# Patient Record
Sex: Female | Born: 1935 | Race: White | Hispanic: No | Smoking: Never smoker
Health system: Southern US, Community
[De-identification: ages and names within clinical notes are randomized; demographics above are authoritative.]

## PROBLEM LIST (undated history)

## (undated) DIAGNOSIS — M1990 Unspecified osteoarthritis, unspecified site: Secondary | ICD-10-CM

## (undated) DIAGNOSIS — K635 Polyp of colon: Secondary | ICD-10-CM

## (undated) DIAGNOSIS — G8929 Other chronic pain: Secondary | ICD-10-CM

## (undated) DIAGNOSIS — M545 Other chronic pain: Secondary | ICD-10-CM

## (undated) DIAGNOSIS — E538 Deficiency of other specified B group vitamins: Secondary | ICD-10-CM

## (undated) DIAGNOSIS — N8110 Cystocele, unspecified: Secondary | ICD-10-CM

## (undated) DIAGNOSIS — D649 Anemia, unspecified: Secondary | ICD-10-CM

## (undated) DIAGNOSIS — K743 Primary biliary cirrhosis: Secondary | ICD-10-CM

## (undated) DIAGNOSIS — K219 Gastro-esophageal reflux disease without esophagitis: Secondary | ICD-10-CM

## (undated) DIAGNOSIS — M419 Scoliosis, unspecified: Secondary | ICD-10-CM

## (undated) DIAGNOSIS — R188 Other ascites: Secondary | ICD-10-CM

## (undated) DIAGNOSIS — K766 Portal hypertension: Secondary | ICD-10-CM

## (undated) DIAGNOSIS — E039 Hypothyroidism, unspecified: Secondary | ICD-10-CM

## (undated) DIAGNOSIS — K5790 Diverticulosis of intestine, part unspecified, without perforation or abscess without bleeding: Secondary | ICD-10-CM

## (undated) HISTORY — DX: Low back pain: M54.5

## (undated) HISTORY — PX: APPENDECTOMY: SHX54

## (undated) HISTORY — DX: Other ascites: R18.8

## (undated) HISTORY — DX: Hypocalcemia: E83.51

## (undated) HISTORY — DX: Other chronic pain: M54.50

## (undated) HISTORY — PX: BLADDER SURGERY: SHX569

## (undated) HISTORY — DX: Portal hypertension: K76.6

## (undated) HISTORY — DX: Gastro-esophageal reflux disease without esophagitis: K21.9

## (undated) HISTORY — DX: Cystocele, unspecified: N81.10

## (undated) HISTORY — DX: Hypothyroidism, unspecified: E03.9

## (undated) HISTORY — DX: Scoliosis, unspecified: M41.9

## (undated) HISTORY — DX: Anemia, unspecified: D64.9

## (undated) HISTORY — DX: Polyp of colon: K63.5

## (undated) HISTORY — DX: Deficiency of other specified B group vitamins: E53.8

## (undated) HISTORY — DX: Diverticulosis of intestine, part unspecified, without perforation or abscess without bleeding: K57.90

## (undated) HISTORY — DX: Other chronic pain: G89.29

## (undated) HISTORY — PX: ABDOMINAL HYSTERECTOMY: SHX81

## (undated) HISTORY — DX: Primary biliary cirrhosis: K74.3

---

## 2001-08-23 ENCOUNTER — Encounter: Payer: Self-pay | Admitting: Family Medicine

## 2001-08-23 ENCOUNTER — Encounter: Admission: RE | Admit: 2001-08-23 | Discharge: 2001-08-23 | Payer: Self-pay | Admitting: Family Medicine

## 2001-11-27 ENCOUNTER — Other Ambulatory Visit: Admission: RE | Admit: 2001-11-27 | Discharge: 2001-11-27 | Payer: Self-pay | Admitting: Obstetrics and Gynecology

## 2001-12-16 ENCOUNTER — Encounter: Payer: Self-pay | Admitting: Obstetrics and Gynecology

## 2001-12-16 ENCOUNTER — Encounter: Admission: RE | Admit: 2001-12-16 | Discharge: 2001-12-16 | Payer: Self-pay | Admitting: Obstetrics and Gynecology

## 2004-04-04 ENCOUNTER — Encounter: Admission: RE | Admit: 2004-04-04 | Discharge: 2004-04-04 | Payer: Self-pay | Admitting: Family Medicine

## 2005-04-14 ENCOUNTER — Other Ambulatory Visit: Admission: RE | Admit: 2005-04-14 | Discharge: 2005-04-14 | Payer: Self-pay | Admitting: Obstetrics and Gynecology

## 2005-05-09 ENCOUNTER — Encounter: Admission: RE | Admit: 2005-05-09 | Discharge: 2005-05-09 | Payer: Self-pay | Admitting: Obstetrics and Gynecology

## 2010-07-04 DIAGNOSIS — R079 Chest pain, unspecified: Secondary | ICD-10-CM

## 2013-05-13 ENCOUNTER — Telehealth: Payer: Self-pay

## 2013-05-13 ENCOUNTER — Telehealth: Payer: Self-pay | Admitting: Internal Medicine

## 2013-05-13 NOTE — Telephone Encounter (Signed)
Rec'd from [place] forward 22 pages to Historical Provider

## 2013-05-26 ENCOUNTER — Encounter: Payer: Self-pay | Admitting: Internal Medicine

## 2013-07-14 NOTE — Telephone Encounter (Signed)
Appointment with Dr Carlean Purl 07-17-13/yf

## 2013-07-17 ENCOUNTER — Ambulatory Visit: Payer: Medicare Other | Admitting: Internal Medicine

## 2013-07-22 ENCOUNTER — Encounter: Payer: Self-pay | Admitting: Internal Medicine

## 2013-09-17 ENCOUNTER — Ambulatory Visit: Payer: Medicare Other | Admitting: Internal Medicine

## 2013-09-17 ENCOUNTER — Encounter: Payer: Self-pay | Admitting: Gastroenterology

## 2013-09-17 ENCOUNTER — Ambulatory Visit (INDEPENDENT_AMBULATORY_CARE_PROVIDER_SITE_OTHER): Payer: Medicare Other | Admitting: Gastroenterology

## 2013-09-17 ENCOUNTER — Other Ambulatory Visit (INDEPENDENT_AMBULATORY_CARE_PROVIDER_SITE_OTHER): Payer: Medicare Other

## 2013-09-17 VITALS — BP 110/60 | HR 68 | Ht 64.0 in | Wt 131.0 lb

## 2013-09-17 DIAGNOSIS — K743 Primary biliary cirrhosis: Secondary | ICD-10-CM

## 2013-09-17 DIAGNOSIS — K7460 Unspecified cirrhosis of liver: Secondary | ICD-10-CM

## 2013-09-17 DIAGNOSIS — K745 Biliary cirrhosis, unspecified: Secondary | ICD-10-CM

## 2013-09-17 LAB — COMPREHENSIVE METABOLIC PANEL
ALT: 21 U/L (ref 0–35)
AST: 30 U/L (ref 0–37)
Albumin: 3.9 g/dL (ref 3.5–5.2)
Alkaline Phosphatase: 109 U/L (ref 39–117)
BUN: 33 mg/dL — ABNORMAL HIGH (ref 6–23)
CO2: 25 mEq/L (ref 19–32)
CREATININE: 1.3 mg/dL — AB (ref 0.4–1.2)
Calcium: 9.3 mg/dL (ref 8.4–10.5)
Chloride: 105 mEq/L (ref 96–112)
GFR: 42.88 mL/min — ABNORMAL LOW (ref >60.00)
Glucose, Bld: 114 mg/dL — ABNORMAL HIGH (ref 70–99)
Potassium: 4.5 mEq/L (ref 3.5–5.1)
Sodium: 137 mEq/L (ref 135–145)
Total Bilirubin: 0.6 mg/dL (ref 0.2–1.2)
Total Protein: 7 g/dL (ref 6.0–8.3)

## 2013-09-17 LAB — CBC
HCT: 34.4 % — ABNORMAL LOW (ref 36.0–46.0)
Hemoglobin: 11.8 g/dL — ABNORMAL LOW (ref 12.0–15.0)
MCHC: 34.4 g/dL (ref 30.0–36.0)
MCV: 92.9 fl (ref 78.0–100.0)
PLATELETS: 129 10*3/uL — AB (ref 150.0–400.0)
RBC: 3.7 Mil/uL — ABNORMAL LOW (ref 3.87–5.11)
RDW: 13.8 % (ref 11.5–15.5)
WBC: 5 10*3/uL (ref 4.0–10.5)

## 2013-09-17 LAB — PROTIME-INR
INR: 1 ratio (ref 0.8–1.0)
Prothrombin Time: 11.4 s (ref 9.6–13.1)

## 2013-09-17 MED ORDER — URSODIOL 300 MG PO CAPS: 300.0000 mg | ORAL_CAPSULE | Freq: Three times a day (TID) | ORAL | Status: DC

## 2013-09-17 NOTE — Patient Instructions (Signed)
Your physician has requested that you go to the basement for the following lab work before leaving today: CBC CMP PT-INR AFP  You have been scheduled for an abdominal ultrasound at [facility] Radiology (1st floor of hospital) on 09-19-2013 at 8 am. Please arrive 15 minutes prior to your appointment for registration. Make certain not to have anything to eat or drink 6 hours prior to your appointment. Should you need to reschedule your appointment, please contact radiology at [phone]. This test typically takes about 30 minutes to perform.  You have a follow up visit scheduled with Dr. Henrene Pastor for 12-11-2013 at 33 AM.  Refill of Ursodiol was sent to your pharmacy.

## 2013-09-18 LAB — AFP TUMOR MARKER: AFP TUMOR MARKER: 12.1 ng/mL — AB (ref 0.0–8.0)

## 2013-09-19 ENCOUNTER — Telehealth: Payer: Self-pay | Admitting: Gastroenterology

## 2013-09-19 ENCOUNTER — Ambulatory Visit (HOSPITAL_COMMUNITY): Payer: Medicare Other

## 2013-09-19 DIAGNOSIS — K7460 Unspecified cirrhosis of liver: Secondary | ICD-10-CM

## 2013-09-19 NOTE — Telephone Encounter (Signed)
Patient no showed her [employer] today. Called Radiology to rescheduled.  You have been scheduled for an abdominal ultrasound at [facility] Radiology (1st floor of hospital) on 09-24-2013 at 930 am. Please arrive 15 minutes prior to your appointment for registration. Make certain not to have anything to eat or drink 6 hours prior to your appointment. Should you need to reschedule your appointment, please contact radiology at [phone]. This test typically takes about 30 minutes to perform.   Called patient to give an appointment date and time. Patient verbalized understanding.

## 2013-09-24 ENCOUNTER — Ambulatory Visit (HOSPITAL_COMMUNITY)
Admission: RE | Admit: 2013-09-24 | Discharge: 2013-09-24 | Disposition: A | Discharge status: Home / Self Care | Payer: Medicare Other | Source: Ambulatory Visit | Attending: Gastroenterology | Admitting: Gastroenterology

## 2013-09-24 DIAGNOSIS — K7460 Unspecified cirrhosis of liver: Secondary | ICD-10-CM

## 2013-09-25 DIAGNOSIS — K743 Primary biliary cirrhosis: Secondary | ICD-10-CM | POA: Insufficient documentation

## 2013-09-25 DIAGNOSIS — K7460 Unspecified cirrhosis of liver: Secondary | ICD-10-CM | POA: Insufficient documentation

## 2013-09-25 NOTE — Progress Notes (Addendum)
09/25/2013 Jessica French [id number] November 21, 1926   HISTORY OF PRESENT ILLNESS:  This is a pleasant 80 year old female who has PBC and was previously followed by Dr. Lincoln Brigham at [facility].  She was last seen there in 05/2012 at which time she was stable and well compensated.  She was asked to continue her ursodiol 300 mg TID as she had been doing and was told to follow up in 8 months.  She is here today to establish care more locally so that she does not have to keep travelling to [place].  She was diagnosed with PBC in 2008 and has never undergone liver biopsy.  She has been maintained on ursodiol 300 mg TID without interruption and has done well with no sign of decompensation.  Her main complaints are fatigue and dry mouth/dry eyes, which they believe is from sicca syndrome related to her PBC.  She denies any abdominal pain or dark/bloody stool.  Denies yellowing of her eyes or change in mental status/confusion.  She has swelling in her ankles but she says that they have been like that as long as she can remember.  She is here today alone.  *She thinks that she had EGD and colonoscopy at some point several years ago at [place] so we will try to obtain those records.   Past Medical History  Diagnosis Date  . PBC (primary biliary cirrhosis)   . Hypothyroidism     Dx before age 75  . Hypocalcemia   . GERD (gastroesophageal reflux disease)   . Vitamin B12 deficiency   . Colon polyps     uncertain pathology  . Scoliosis   . Chronic low back pain   . Scoliosis   . Female bladder prolapse    Past Surgical History  Procedure Laterality Date  . Bladder surgery      prolapse, multiple  . Abdominal hysterectomy    . Appendectomy      reports that she has never smoked. She has never used smokeless tobacco. She reports that she does not drink alcohol or use illicit drugs. family history includes Breast cancer in her maternal aunt; Cancer in her mother; Diabetes in her father; Heart disease in her  brother; Liver disease in an other family member; Stomach cancer in her maternal grandmother. Allergies  Allergen Reactions  . Tobramycin     EYE DROPS      Outpatient Encounter Prescriptions as of 09/17/2013  Medication Sig  . calcium carbonate (TUMS - DOSED IN MG ELEMENTAL CALCIUM) 500 MG chewable tablet Chew 1 tablet by mouth daily.  Marland Kitchen HYDROcodone-acetaminophen (VICODIN) 5-500 MG per tablet Take 1 tablet by mouth every 6 (six) hours as needed for pain.  Marland Kitchen levothyroxine (SYNTHROID, LEVOTHROID) 50 MCG tablet Take 50 mcg by mouth daily before breakfast.  . MAGNESIUM PO Take 1 tablet by mouth daily.  . meloxicam (MOBIC) 15 MG tablet Take 15 mg by mouth daily.  Marland Kitchen MILK THISTLE PO Take 1 tablet by mouth daily.  . Multiple Vitamins-Minerals (CENTRUM SILVER PO) Take 1 tablet by mouth daily.  . ursodiol (ACTIGALL) 300 MG capsule Take 1 capsule (300 mg total) by mouth 3 (three) times daily.  . vitamin B-12 (CYANOCOBALAMIN) 1000 MCG tablet Take 1,000 mcg by mouth daily.  . [DISCONTINUED] ursodiol (ACTIGALL) 300 MG capsule Take 300 mg by mouth 3 (three) times daily.     REVIEW OF SYSTEMS  : All other systems reviewed and negative except where noted in the  History of Present Illness.   PHYSICAL EXAM: BP 110/60  Pulse 68  Ht 5\' 4"  (1.626 m)  Wt 131 lb (59.421 kg)  BMI 22.47 kg/m2 General: Well developed white female in no acute distress Head: Normocephalic and atraumatic Eyes:  Sclerae anicteric, conjunctiva pink. Ears: Normal auditory acuity  Lungs: Clear throughout to auscultation Heart: Regular rate and rhythm Abdomen: Soft, non-distended.  Normal bowel sounds.  Non-tender. Musculoskeletal: Symmetrical with no gross deformities  Skin: No lesions on visible extremities Extremities:  Mild B/L LE non-pitting edema. Neurological: Alert oriented x 4, grossly non-focal Psychological:  Alert and cooperative. Normal mood and affect  ASSESSMENT AND PLAN: -PBC:  Well compensated when last  seen by Dr. Lincoln Brigham in 05/2012.  Appears clinically well today, but here to establish GI care so she no longer has to make the trip to [facility].  Will start with some labs including CBC, CMP, PT/INR, and AFP.  Will also perform imaging in the form of ultrasound.  Continue ursodiol 300 mg TID.  Follow-up in 3 months with Dr. Henrene Pastor.  *We will also obtain previous endoscopy records from [facility].  **Addendum:  Colonoscopy report from 12/2031 by Dr. Sammuel Cooper revealed left sided diverticulosis and two 1.0 cm rectal polyps that were removed.  One polyp was tubulovillous adenoma with no high-grade dysplasia but change appeared to extend to the margin of the resection; the other polyp was a tubular adenoma with marked acute inflammation without high-grade dysplasia.  I did not receive any upper endoscopy reports.

## 2013-09-26 NOTE — Progress Notes (Signed)
Agree 

## 2013-12-11 ENCOUNTER — Ambulatory Visit: Payer: Medicare Other | Admitting: Internal Medicine

## 2013-12-12 ENCOUNTER — Encounter: Payer: Self-pay | Admitting: Internal Medicine

## 2013-12-12 ENCOUNTER — Ambulatory Visit (INDEPENDENT_AMBULATORY_CARE_PROVIDER_SITE_OTHER): Payer: Medicare Other | Admitting: Internal Medicine

## 2013-12-12 VITALS — BP 104/60 | HR 80 | Ht 60.5 in | Wt 128.5 lb

## 2013-12-12 DIAGNOSIS — K766 Portal hypertension: Secondary | ICD-10-CM

## 2013-12-12 DIAGNOSIS — K7469 Other cirrhosis of liver: Secondary | ICD-10-CM

## 2013-12-12 DIAGNOSIS — K743 Primary biliary cirrhosis: Secondary | ICD-10-CM

## 2013-12-12 MED ORDER — URSODIOL 300 MG PO CAPS: 300.0000 mg | ORAL_CAPSULE | Freq: Three times a day (TID) | ORAL | Status: DC

## 2013-12-12 NOTE — Progress Notes (Signed)
HISTORY OF PRESENT ILLNESS:  Jessica French is a 80 y.o. female who established as a new GI patient 09/17/2013 for management of her chronic primary biliary cirrhosis. She has been cared for at [facility]. See that synopsis. She presents today for routine followup. At the time of her last visit blood work was obtained. She did have slightly low platelets of 129,000. Liver tests and albumin were normal. PT/INR was normal. Alpha-fetoprotein was 12.1. Abdominal ultrasound was performed. Changes of cirrhosis with mild splenomegaly noted. Otherwise negative. No hepatic lesions. We reviewed these data today. She has been doing well since that visit. No problems with edema, mentation, GI bleeding, itching, or jaundice. She does continue with nonspecific fatigue. She lives in [place]. Her primary care physician is Dr. Wenda Overland. She requested a refill of her ursodiol today. GI review of systems is otherwise remarkable for belching and flatus  REVIEW OF SYSTEMS:  All non-GI ROS negative except for sinus and allergy, arthritis, back pain, fatigue, muscle cramps, itching, hearing problems, sleeping problems, increased thirst, urinary leakage  Past Medical History  Diagnosis Date  . PBC (primary biliary cirrhosis)   . Hypothyroidism     Dx before age 27  . Hypocalcemia   . GERD (gastroesophageal reflux disease)   . Vitamin B12 deficiency   . Colon polyps     uncertain pathology  . Scoliosis   . Chronic low back pain   . Scoliosis   . Female bladder prolapse     Past Surgical History  Procedure Laterality Date  . Bladder surgery      prolapse, multiple  . Abdominal hysterectomy    . Appendectomy      Social History Jessica French  reports that she has never smoked. She has never used smokeless tobacco. She reports that she does not drink alcohol or use illicit drugs.  family history includes Breast cancer in her maternal aunt; Diabetes in her father; Heart disease in her brother; Leukemia in her  mother; Liver disease in an other family member; Stomach cancer in her maternal grandmother.  Allergies  Allergen Reactions  . Tobramycin     EYE DROPS       PHYSICAL EXAMINATION: Vital signs: BP 104/60  Pulse 80  Ht 5' 0.5" (1.537 m)  Wt 128 lb 8 oz (58.287 kg)  BMI 24.67 kg/m2  Constitutional: generally well-appearing, no acute distress Psychiatric: alert and oriented x3, cooperative Eyes: extraocular movements intact, anicteric, conjunctiva pink Mouth: oral pharynx moist, no lesions Neck: supple no lymphadenopathy Cardiovascular: heart regular rate and rhythm, no murmur Lungs: clear to auscultation bilaterally Abdomen: soft, nontender, nondistended, no obvious ascites, no peritoneal signs, normal bowel sounds, no organomegaly Rectal: Omitted Extremities: no lower extremity edema bilaterally. Varicose veins present Skin: no lesions on visible extremities Neuro: No focal deficits. No asterixis.    ASSESSMENT:  #1. Primary biliary cirrhosis with compensated hepatic cirrhosis. #2. History of adenomatous colon polyps. Last colonoscopy 2003 #3. General medical problems. Stable   PLAN:  #1. Continue ursodiol. Prescription refilled. 300 mg 3 times a day #2. Continue vitamin supplements #3. Office followup in 9 months. Repeat laboratories and imaging at that time. Interval followup as needed

## 2013-12-12 NOTE — Patient Instructions (Signed)
We have sent the following medications to your pharmacy for you to pick up at your convenience:  Ursodiol  Please follow up with Dr. Henrene Pastor in 9 months

## 2013-12-15 ENCOUNTER — Telehealth: Payer: Self-pay | Admitting: Internal Medicine

## 2013-12-17 NOTE — Telephone Encounter (Signed)
Sent Dr. Henrene Pastor message asking if there is an alternative to [place].  Awaiting response

## 2013-12-24 ENCOUNTER — Telehealth: Payer: Self-pay

## 2013-12-24 NOTE — Telephone Encounter (Signed)
See phone note dated 12/24/2013

## 2013-12-24 NOTE — Telephone Encounter (Signed)
Spoke with patient and told her that per Dr. Henrene Pastor there was no alternative to her Ursodiol.  Patient stated she is in the donut hole and is going to check into some new plans at the first of the year.

## 2013-12-24 NOTE — Telephone Encounter (Signed)
Message copied by Audrea Muscat on Wed Dec 24, 2013  4:00 PM ------      Message from: Irene Shipper      Created: Wed Dec 17, 2013 11:56 AM       No. It should be exactly what she has been taking for years. Generic, if available, would be okay(ursodiol is the generic name)       ----- Message -----         From: Audrea Muscat, CMA         Sent: 12/17/2013  11:10 AM           To: Irene Shipper, MD            Patient has called stating the Ursodiol she takes is too expensive and wants to know if there is an alternative.  Please advise.       ------

## 2014-03-18 ENCOUNTER — Telehealth: Payer: Self-pay | Admitting: Internal Medicine

## 2014-03-25 ENCOUNTER — Other Ambulatory Visit: Payer: Self-pay

## 2014-03-25 NOTE — Telephone Encounter (Signed)
Faxed tier exception request form for Actig[employer] to [employer].  Awaiting response.  Spoke with patient and let her know I am awaiting answer from [employer].  Patient understood.

## 2014-04-01 ENCOUNTER — Telehealth: Payer: Self-pay

## 2014-04-01 NOTE — Telephone Encounter (Signed)
Spoke with patient and told her that the Actigall coverage was denied.  Patient concerned that going forward she will not be able to afford it at all and will need to stop.  Told her I would ask Dr. Henrene Pastor how serious the ramifications would be if she had to stop taking Actigall.  Patient agreed.

## 2014-04-01 NOTE — Telephone Encounter (Signed)
Communicated Dr. Blanch Media answer to patient: that with a stable liver profile he would not expect acute decompensation if she had to stop the Actigall.  Patient is going to communicate with her insurance company to see if she has any alternatives regarding the cost.  I told her to call if she needed anything else.  Patient agreed.

## 2014-04-01 NOTE — Telephone Encounter (Signed)
-----   Message from Irene Shipper, MD sent at 04/01/2014  9:20 AM EST ----- Hard to know for certain, but at her age with stable liver profile, reasonable to stop if she can't afford it. I would not expect acute decompensation. ----- Message -----    From: Audrea Muscat, CMA    Sent: 04/01/2014   9:10 AM      To: Irene Shipper, MD  Patient's insurance will not cover her Actigall at all and she is concerned she may not be able to afford it.  She wanted to know what the ramifications would be if it was so cost prohibitive she had to stop taking it altogether.

## 2014-04-02 NOTE — Telephone Encounter (Signed)
See note dated 04/01/2014

## 2014-11-03 ENCOUNTER — Encounter: Payer: Self-pay | Admitting: Internal Medicine

## 2014-12-16 ENCOUNTER — Ambulatory Visit (INDEPENDENT_AMBULATORY_CARE_PROVIDER_SITE_OTHER): Payer: Medicare Other | Admitting: Physician Assistant

## 2014-12-16 ENCOUNTER — Encounter: Payer: Self-pay | Admitting: Physician Assistant

## 2014-12-16 ENCOUNTER — Other Ambulatory Visit (INDEPENDENT_AMBULATORY_CARE_PROVIDER_SITE_OTHER): Payer: Medicare Other

## 2014-12-16 VITALS — BP 140/66 | HR 76 | Ht 64.0 in | Wt 125.6 lb

## 2014-12-16 DIAGNOSIS — R1031 Right lower quadrant pain: Secondary | ICD-10-CM

## 2014-12-16 DIAGNOSIS — K625 Hemorrhage of anus and rectum: Secondary | ICD-10-CM | POA: Diagnosis not present

## 2014-12-16 DIAGNOSIS — K743 Primary biliary cirrhosis: Secondary | ICD-10-CM

## 2014-12-16 DIAGNOSIS — R197 Diarrhea, unspecified: Secondary | ICD-10-CM

## 2014-12-16 LAB — PROTIME-INR
INR: 1.1 ratio — AB (ref 0.8–1.0)
PROTHROMBIN TIME: 12.7 s (ref 9.6–13.1)

## 2014-12-16 NOTE — Progress Notes (Signed)
Agree with assessment and plans as outlined 

## 2014-12-16 NOTE — Progress Notes (Signed)
Patient ID: Jessica French, female   DOB: [birth year], 81 y.o.   MRN: [id number]   Subjective:    Patient ID: Jessica French, female    DOB: January 09, 1927, 81 y.o.   MRN: [id number]  HPI Jessica French is an 81 year old white female known to Dr. Scarlette Shorts who has a long history of primary biliary cirrhosis, with compensated cirrhosis. She has been maintained on Urso for many years. She was last seen in October 2015. She comes in today referred by her primary care physician Dr.  Wenda Overland in [place] after a recent episode of diarrhea with bright red blood per rectum. Patient says this episodes occurred about 3-4 weeks ago. She had one occasion of diarrhea followed by bright red blood dripping into the commode and another episode with loose stool and then blood noted on the tissue. She said during that episode she had diarrhea and urgency for about 6 days, there was some vague nausea no vomiting she did have some low-grade fever and lower abdominal cramping and discomfort. She has not had any recurrent bleeding or problems with diarrhea over the past 2 weeks. She denies any rectal discomfort. Her last colonoscopy was done in 2003 per Dr. Sammuel Cooper noted to have left-sided diverticulosis and 2, 1 cm rectal polyps were removed one was a tubular adenoma the other a tubovillous adenoma.  We requested recent labs done by her PCP. These were done on 12/09/2014 with hemoglobin 11 hematocrit of 33.1 MCV of 97 platelet count 81,000 a bit lower than 1 year ago. Albumin 3.3, alkaline phosphatase 141 liver tests otherwise normal, creatinine 0.84  Review of Systems Pertinent positive and negative review of systems were noted in the above HPI section.  All other review of systems was otherwise negative.  Outpatient Encounter Prescriptions as of 12/16/2014  Medication Sig  . calcium carbonate (TUMS - DOSED IN MG ELEMENTAL CALCIUM) 500 MG chewable tablet Chew 1 tablet by mouth as needed.   Marland Kitchen co-enzyme Q-10 30 MG capsule Take 30 mg by mouth daily.    . ferrous sulfate 325 (65 FE) MG tablet Take 325 mg by mouth daily with breakfast.  . folic acid (FOLVITE) 1 MG tablet Take 1 mg by mouth daily.  Marland Kitchen levothyroxine (SYNTHROID, LEVOTHROID) 50 MCG tablet Take 50 mcg by mouth daily before breakfast.  . MAGNESIUM PO Take 1 tablet by mouth daily.  Marland Kitchen MILK THISTLE PO Take 1 tablet by mouth daily.  . Misc Natural Products (OSTEO BI-FLEX ADV DOUBLE ST) CAPS Take 1 capsule by mouth daily.  . Multiple Vitamins-Minerals (CENTRUM SILVER PO) Take 1 tablet by mouth daily.  . Multiple Vitamins-Minerals (PRESERVISION AREDS 2) CAPS Take 1 capsule by mouth daily.  . Probiotic Product (FLORAJEN3 PO) Take 1 capsule by mouth daily.  . ursodiol (ACTIGALL) 300 MG capsule Take 1 capsule (300 mg total) by mouth 3 (three) times daily.  . vitamin B-12 (CYANOCOBALAMIN) 1000 MCG tablet Take 1,000 mcg by mouth daily.  . vitamin E 400 UNIT capsule Take 400 Units by mouth daily.  . [DISCONTINUED] HYDROcodone-acetaminophen (VICODIN) 5-500 MG per tablet Take 1 tablet by mouth every 6 (six) hours as needed for pain.  . [DISCONTINUED] meloxicam (MOBIC) 15 MG tablet Take 15 mg by mouth daily.   No facility-administered encounter medications on file as of 12/16/2014.   Allergies  Allergen Reactions  . Tobramycin     EYE DROPS   Patient Active Problem List   Diagnosis Date Noted  . Cirrhosis of liver without mention  of alcohol 09/25/2013  . Primary biliary cirrhosis ([place]) 09/25/2013   Social History   Social History  . Marital Status: Divorced    Spouse Name: N/A  . Number of Children: 2  . Years of Education: N/A   Occupational History  . retired    Social History Main Topics  . Smoking status: Never Smoker   . Smokeless tobacco: Never Used  . Alcohol Use: No  . Drug Use: No  . Sexual Activity: Not on file   Other Topics Concern  . Not on file   Social History Narrative    Jessica French's family history includes Breast cancer in her maternal aunt; Diabetes in  her father; Heart disease in her brother; Leukemia in her mother; Liver disease in an other family member; Stomach cancer in her maternal grandmother.      Objective:    Filed Vitals:   12/16/14 1048  BP: 140/66  Pulse: 76    Physical Exam  well-developed elderly white female in no acute distress, quite pleasant, accompanied by daughter blood pressure 140/66, pulse 76 height 5 foot 4 weight 125. HEENT; nontraumatic normocephalic EOMI PERRLA sclera anicteric, Cardiovascular; regular rate and rhythm with S1-S2 no murmur or gallop, Pulmonary ;clear bilaterally, Abdomen; soft with some mild tenderness bilaterally in the lower quadrants right greater than left no guarding or rebound no palpable mass or hepatosplenomegaly no fluid wave, Rectal ;exam no external hemorrhoids noted digital exam no lesion noted scant stool Hemoccult negative, Ext; no clubbing cyanosis or edema skin warm and dry, Neuropsych ;mood and affect appropriate she is somewhat HOH       Assessment & Plan:   #1 81 yo female with PBC longstanding, maintained on Urso- labs stable except platelet count lower than 2015. #2  2 recent episodes of brb per rectum - associated with diarrhea and lower abdominal discomfort . Diarrhea has resolved ,lower abdominal  discomfort persists. Bleeding may have been secondary to internal hemorrhoids nut last colonoscopy 13 years  Ago so cannot r/o occult lesion. #3 Mild anemia- chronic #4 thrombocyopenia - chronic   Plan; check PT, AFP   Schedule for Ct abd/pelvis with contrast  Due to age and cirrhosis would like to avoid colonoscopy- pt advised to call for any recurrence of bleeding, and would reconsider for colonoscopy   Continue Urso 300 mg TID     Alfredia Ferguson PA-C 12/16/2014   Cc: Celedonio Savage, MD

## 2014-12-16 NOTE — Patient Instructions (Signed)
Please go to the basement level to have your labs drawn.   You have been scheduled for a CT scan of the abdomen and pelvis at [facility] ([address]---this is in the same building as [facility]).   You are scheduled on  Wednesday 12-23-2014 at 9:30 am. You should arrive at 9:15 am prior to your appointment time for registration. Please follow the written instructions below on the day of your exam:  WARNING: IF YOU ARE ALLERGIC TO IODINE/X-RAY DYE, PLEASE NOTIFY RADIOLOGY IMMEDIATELY AT [phone]! YOU WILL BE GIVEN A 13 HOUR PREMEDICATION PREP.  1) Do not eat or drink anything after 5:30 am  (4 hours prior to your test) 2) You have been given 2 bottles of oral contrast to drink. The solution may taste better if refrigerated, but do NOT add ice or any other liquid to this solution. Shake  well before drinking.    Drink 1 bottle of contrast @ 7:30 am  (2 hours prior to your exam)  Drink 1 bottle of contrast @ 8:30 am  (1 hour prior to your exam)  You may take any medications as prescribed with a small amount of water except for the following: Metformin, Glucophage, Glucovance, Avandamet, Riomet, Fortamet, Actoplus Met, Janumet, Glumetza or Metaglip. The above medications must be held the day of the exam AND 48 hours after the exam.  The purpose of you drinking the oral contrast is to aid in the visualization of your intestinal tract. The contrast solution may cause some diarrhea. Before your exam is started, you will be given a small amount of fluid to drink. Depending on your individual set of symptoms, you may also receive an intravenous injection of x-ray contrast/dye. Plan on being at [facility] for 30 minutes or long, depending on the type of exam you are having performed.  If you have any questions regarding your exam or if you need to reschedule, you may call the CT department at [phone] between the hours of 8:00 am and 5:00 pm,  Monday-Friday.  ________________________________________________________________________   Call us for any recurrent bleeding.

## 2014-12-17 LAB — AFP TUMOR MARKER: AFP TUMOR MARKER: 5.5 ng/mL (ref <6.1)

## 2014-12-23 ENCOUNTER — Ambulatory Visit (INDEPENDENT_AMBULATORY_CARE_PROVIDER_SITE_OTHER)
Admission: RE | Admit: 2014-12-23 | Discharge: 2014-12-23 | Disposition: A | Discharge status: Home / Self Care | Payer: Medicare Other | Source: Ambulatory Visit | Attending: Physician Assistant | Admitting: Physician Assistant

## 2014-12-23 DIAGNOSIS — K625 Hemorrhage of anus and rectum: Secondary | ICD-10-CM

## 2014-12-23 DIAGNOSIS — R197 Diarrhea, unspecified: Secondary | ICD-10-CM

## 2014-12-23 DIAGNOSIS — K743 Primary biliary cirrhosis: Secondary | ICD-10-CM | POA: Diagnosis not present

## 2014-12-23 DIAGNOSIS — R1031 Right lower quadrant pain: Secondary | ICD-10-CM | POA: Diagnosis not present

## 2014-12-23 MED ORDER — IOHEXOL 300 MG/ML  SOLN
100.0000 mL | Freq: Once | INTRAMUSCULAR | Status: AC | PRN
Start: 2014-12-23 — End: 2014-12-23
  Administered 2014-12-23: 100 mL via INTRAVENOUS

## 2014-12-29 ENCOUNTER — Telehealth: Payer: Self-pay | Admitting: Internal Medicine

## 2014-12-30 MED ORDER — URSODIOL 300 MG PO CAPS: 300.0000 mg | ORAL_CAPSULE | Freq: Three times a day (TID) | ORAL | Status: AC

## 2014-12-30 NOTE — Telephone Encounter (Signed)
Refilled Ursodiol 90 day supply

## 2015-03-22 ENCOUNTER — Telehealth: Payer: Self-pay | Admitting: Physician Assistant

## 2015-03-22 NOTE — Telephone Encounter (Signed)
Jessica French pt that saw Nicoletta Ba PA in October for BRB from rectum. Pt states that this was thought to be from an internal hemorrhoid but that over the weekend she had 3 episodes of bleeding and one was quite heavy. States it is not associated only with bowel movements. Pt scheduled to see Dr. Henrene French 03/24/15@9am . Pt aware of appt.

## 2015-03-24 ENCOUNTER — Ambulatory Visit (INDEPENDENT_AMBULATORY_CARE_PROVIDER_SITE_OTHER): Payer: Medicare Other | Admitting: Internal Medicine

## 2015-03-24 ENCOUNTER — Encounter: Payer: Self-pay | Admitting: Internal Medicine

## 2015-03-24 ENCOUNTER — Telehealth: Payer: Self-pay | Admitting: Internal Medicine

## 2015-03-24 VITALS — BP 140/56 | HR 76 | Ht 60.5 in | Wt 130.1 lb

## 2015-03-24 DIAGNOSIS — K743 Primary biliary cirrhosis: Secondary | ICD-10-CM

## 2015-03-24 DIAGNOSIS — K766 Portal hypertension: Secondary | ICD-10-CM | POA: Diagnosis not present

## 2015-03-24 DIAGNOSIS — K7469 Other cirrhosis of liver: Secondary | ICD-10-CM

## 2015-03-24 DIAGNOSIS — R159 Full incontinence of feces: Secondary | ICD-10-CM

## 2015-03-24 DIAGNOSIS — K625 Hemorrhage of anus and rectum: Secondary | ICD-10-CM | POA: Diagnosis not present

## 2015-03-24 MED ORDER — NA SULFATE-K SULFATE-MG SULF 17.5-3.13-1.6 GM/177ML PO SOLN: 1.0000 | Freq: Once | ORAL | Status: DC

## 2015-03-24 NOTE — Patient Instructions (Signed)
You have been scheduled for a colonoscopy. Please follow written instructions given to you at your visit today.  Please pick up your prep supplies at the pharmacy within the next 1-3 days. If you use inhalers (even only as needed), please bring them with you on the day of your procedure.   

## 2015-03-24 NOTE — Progress Notes (Signed)
HISTORY OF PRESENT ILLNESS:  Jessica French is a 82 y.o. female with hepatic cirrhosis secondary to [place], associated pancytopenia and hypothyroidism. She presents today with chief complaint of severe recurrent rectal bleeding. The patient was last evaluated in this office 12/16/2014 for loose stools and rectal bleeding. See that dictation. Rectal examination at that time was unremarkable with Hemoccult negative stool. CT scan of the abdomen and pelvis was obtained and found to be unremarkable with changes of advanced cirrhosis noted. Patient reports to me that she had multiple episodes of severe rectal bleeding last week. Did not seek medical attention. Continues to describe occasional loose stools with incontinence. Generally with normal stools and no incontinence. She is on multiple medications as listed. Outside blood work from her primary care physician's office, Dr. Celedonio Savage, has been reviewed. Most recent hemoglobin 10.7, white blood cell count 2.8, platelets 89,000. She is also followed by oncology/hematology for her pancytopenia. Prothrombin time from last year was normal. Last colonoscopy was in 2003 with Dr. Timmothy Euler. She was found to have 210 mm rectal polyps. One was a tubular adenoma the other tubulovillous adenoma. No follow-up since. GI review of systems is otherwise remarkable for nausea and gas  REVIEW OF SYSTEMS:  All non-GI ROS negative except for arthritis, confusion, itching, muscle cramps, hearing problems, fatigue, skin rash, sleeping problems, ankle edema, urinary frequency, urinary leakage, shortness of breath, voice change  Past Medical History  Diagnosis Date  . PBC (primary biliary cirrhosis) ([place])   . Hypothyroidism     Dx before age 60  . Hypocalcemia   . GERD (gastroesophageal reflux disease)   . Vitamin B12 deficiency   . Colon polyps     uncertain pathology  . Scoliosis   . Chronic low back pain   . Scoliosis   . Female bladder prolapse   . Anemia     Past  Surgical History  Procedure Laterality Date  . Bladder surgery      prolapse, multiple  . Abdominal hysterectomy    . Appendectomy      Social History Jessica French  reports that she has never smoked. She has never used smokeless tobacco. She reports that she does not drink alcohol or use illicit drugs.  family history includes Breast cancer in her maternal aunt; Diabetes in her father; Heart disease in her brother; Leukemia in her mother; Stomach cancer in her maternal grandmother.  Allergies  Allergen Reactions  . Tobramycin     EYE DROPS       PHYSICAL EXAMINATION: Vital signs: BP 140/56 mmHg  Pulse 76  Ht 5' 0.5" (1.537 m)  Wt 130 lb 2 oz (59.024 kg)  BMI 24.99 kg/m2  Constitutional: Elderly, thin, somewhat frail but otherwise generally well-appearing, no acute distress Psychiatric: alert and oriented x3, cooperative Eyes: extraocular movements intact, anicteric, conjunctiva pink Mouth: oral pharynx moist, no lesions Neck: supple without adenopathy or thyromegaly Lymph: no lymphadenopathy Cardiovascular: heart regular rate and rhythm, no murmur Lungs: clear to auscultation bilaterally Abdomen: soft, nontender, nondistended, no obvious ascites, no peritoneal signs, normal bowel sounds, no organomegaly Rectal: Deferred. Previous rectal exam unremarkable as outlined Extremities: no clubbing or cyanosis. 2+ lower extremity edema bilaterally Skin: no lesions on visible extremities Neuro: No focal deficits. Normal deep tendon reflexes. No asterixis.  ASSESSMENT:  #1. Recurrent rectal bleeding. Moderately severe as described. She could have internal hemorrhoids or rectal varices related to her portal hypertension. She does have known diverticular disease of this is less likely  the cause. Having had advanced neoplasia of the rectum, rule out recurrent neoplasia important. #2. Hepatic cirrhosis with portal hypertension secondary to [place] #3. Pancytopenia secondary to portal  hypertension #4. Intermittent fecal incontinence  PLAN:  #1. At this point I think it's prudent to move forward with colonoscopy given the recurrent and moderately severe nature of the bleeding. Patient is high-risk given her age and comorbidities. We will set of the examination at the hospital with MAC.The nature of the procedure, as well as the risks, benefits, and alternatives were carefully and thoroughly reviewed with the patient. Ample time for discussion and questions allowed. The patient understood, was satisfied, and agreed to proceed.

## 2015-03-24 NOTE — Telephone Encounter (Signed)
Clarified with Butch Penny that the rx was for [employer]

## 2015-03-26 ENCOUNTER — Other Ambulatory Visit: Payer: Self-pay

## 2015-03-26 DIAGNOSIS — K625 Hemorrhage of anus and rectum: Secondary | ICD-10-CM

## 2015-04-02 ENCOUNTER — Encounter (HOSPITAL_COMMUNITY): Payer: Self-pay | Admitting: *Deleted

## 2015-04-08 ENCOUNTER — Telehealth: Payer: Self-pay

## 2015-04-08 NOTE — Telephone Encounter (Signed)
Pt states she has been eating a lot of salads and coleslaw recently and she started having nausea and heartburn a couple of days ago. Discussed with pt that she can take prilosec or nexium otc to see if this helps. Pt aware and knows to keep her colon appt as scheduled.

## 2015-04-12 ENCOUNTER — Telehealth: Payer: Self-pay | Admitting: Internal Medicine

## 2015-04-12 NOTE — Telephone Encounter (Signed)
Prep instructions reviewed with pt over the phone.

## 2015-04-13 ENCOUNTER — Ambulatory Visit (HOSPITAL_COMMUNITY): Payer: Medicare Other | Admitting: Anesthesiology

## 2015-04-13 ENCOUNTER — Ambulatory Visit (HOSPITAL_COMMUNITY)
Admission: RE | Admit: 2015-04-13 | Discharge: 2015-04-13 | Disposition: A | Discharge status: Home / Self Care | Payer: Medicare Other | Source: Ambulatory Visit | Attending: Internal Medicine | Admitting: Internal Medicine

## 2015-04-13 ENCOUNTER — Other Ambulatory Visit: Payer: Self-pay

## 2015-04-13 ENCOUNTER — Encounter (HOSPITAL_COMMUNITY)
Admission: RE | Disposition: A | Discharge status: Home / Self Care | Payer: Self-pay | Source: Ambulatory Visit | Attending: Internal Medicine

## 2015-04-13 ENCOUNTER — Encounter (HOSPITAL_COMMUNITY): Payer: Self-pay | Admitting: Certified Registered Nurse Anesthetist

## 2015-04-13 DIAGNOSIS — D125 Benign neoplasm of sigmoid colon: Secondary | ICD-10-CM | POA: Diagnosis not present

## 2015-04-13 DIAGNOSIS — M1990 Unspecified osteoarthritis, unspecified site: Secondary | ICD-10-CM | POA: Diagnosis not present

## 2015-04-13 DIAGNOSIS — D61818 Other pancytopenia: Secondary | ICD-10-CM | POA: Diagnosis not present

## 2015-04-13 DIAGNOSIS — E039 Hypothyroidism, unspecified: Secondary | ICD-10-CM | POA: Diagnosis not present

## 2015-04-13 DIAGNOSIS — K5730 Diverticulosis of large intestine without perforation or abscess without bleeding: Secondary | ICD-10-CM | POA: Insufficient documentation

## 2015-04-13 DIAGNOSIS — Z79899 Other long term (current) drug therapy: Secondary | ICD-10-CM | POA: Diagnosis not present

## 2015-04-13 DIAGNOSIS — K648 Other hemorrhoids: Secondary | ICD-10-CM | POA: Diagnosis not present

## 2015-04-13 DIAGNOSIS — K644 Residual hemorrhoidal skin tags: Secondary | ICD-10-CM | POA: Diagnosis not present

## 2015-04-13 DIAGNOSIS — K219 Gastro-esophageal reflux disease without esophagitis: Secondary | ICD-10-CM | POA: Diagnosis not present

## 2015-04-13 DIAGNOSIS — K743 Primary biliary cirrhosis: Secondary | ICD-10-CM | POA: Insufficient documentation

## 2015-04-13 DIAGNOSIS — R159 Full incontinence of feces: Secondary | ICD-10-CM | POA: Insufficient documentation

## 2015-04-13 DIAGNOSIS — K766 Portal hypertension: Secondary | ICD-10-CM | POA: Diagnosis not present

## 2015-04-13 DIAGNOSIS — D123 Benign neoplasm of transverse colon: Secondary | ICD-10-CM | POA: Diagnosis not present

## 2015-04-13 DIAGNOSIS — D122 Benign neoplasm of ascending colon: Secondary | ICD-10-CM | POA: Diagnosis not present

## 2015-04-13 DIAGNOSIS — K625 Hemorrhage of anus and rectum: Secondary | ICD-10-CM | POA: Insufficient documentation

## 2015-04-13 DIAGNOSIS — Z86010 Personal history of colonic polyps: Secondary | ICD-10-CM | POA: Insufficient documentation

## 2015-04-13 HISTORY — PX: COLONOSCOPY: SHX5424

## 2015-04-13 HISTORY — DX: Unspecified osteoarthritis, unspecified site: M19.90

## 2015-04-13 SURGERY — COLONOSCOPY
Anesthesia: Monitor Anesthesia Care

## 2015-04-13 MED ORDER — LACTATED RINGERS IV SOLN
INTRAVENOUS | Status: DC | PRN
  Administered 2015-04-13: 10:00:00 via INTRAVENOUS

## 2015-04-13 MED ORDER — PROPOFOL 500 MG/50ML IV EMUL
INTRAVENOUS | Status: DC | PRN
  Administered 2015-04-13: 250 ug/kg/min via INTRAVENOUS

## 2015-04-13 MED ORDER — HYDROCORTISONE ACETATE 25 MG RE SUPP: 25.0000 mg | Freq: Every day | RECTAL | Status: DC

## 2015-04-13 MED ORDER — SODIUM CHLORIDE 0.9 % IJ SOLN
INTRAMUSCULAR | Status: AC
  Filled 2015-04-13: qty 10

## 2015-04-13 MED ORDER — PROPOFOL 10 MG/ML IV BOLUS
INTRAVENOUS | Status: AC
  Filled 2015-04-13: qty 20

## 2015-04-13 MED ORDER — ONDANSETRON HCL 4 MG/2ML IJ SOLN
INTRAMUSCULAR | Status: AC
  Filled 2015-04-13: qty 2

## 2015-04-13 MED ORDER — EPHEDRINE SULFATE 50 MG/ML IJ SOLN
INTRAMUSCULAR | Status: AC
  Filled 2015-04-13: qty 1

## 2015-04-13 MED ORDER — SODIUM CHLORIDE 0.9 % IV SOLN: INTRAVENOUS | Status: DC

## 2015-04-13 MED ORDER — ONDANSETRON HCL 4 MG/2ML IJ SOLN
INTRAMUSCULAR | Status: DC | PRN
Start: 2015-04-13 — End: 2015-04-13
  Administered 2015-04-13: 4 mg via INTRAVENOUS

## 2015-04-13 MED ORDER — EPHEDRINE SULFATE 50 MG/ML IJ SOLN
INTRAMUSCULAR | Status: DC | PRN
  Administered 2015-04-13: 10 mg via INTRAVENOUS

## 2015-04-13 MED ORDER — PROPOFOL 10 MG/ML IV BOLUS
INTRAVENOUS | Status: AC
  Filled 2015-04-13: qty 40

## 2015-04-13 NOTE — Op Note (Signed)
[facility] [place], [address]   COLONOSCOPY PROCEDURE REPORT  PATIENT: Jessica French, Surgeon  MR#: [id number] BIRTHDATE: 31-Aug-1926 , 48  yrs. old GENDER: female ENDOSCOPIST: Eustace Quail, MD REFERRED CR:[id number] Wenda Overland, M.D. PROCEDURE DATE:  04/13/2015 PROCEDURE:   Colonoscopy, diagnostic and Colonoscopy with snare polypectomy x 3  ASA CLASS:   Class III INDICATIONS:rectal bleeding. MEDICATIONS: Monitored anesthesia care and Per Anesthesia  DESCRIPTION OF PROCEDURE:   After the risks benefits and alternatives of the procedure were thoroughly explained, informed consent was obtained.  The digital rectal exam revealed external hemorrhoids-bleeding.   The Pentax Ped Colon [id number]  endoscope was introduced through the anus and advanced to the cecum, which was identified by both the appendix and ileocecal valve. No adverse events experienced.   The quality of the prep was excellent. (Suprep was used)  The instrument was then slowly withdrawn as the colon was fully examined. Estimated blood loss is zero unless otherwise noted in this procedure report.  COLON FINDINGS: Three polyps were found in the ascending (5 mm), transverse rep sees 5 mm), and sigmoid colon(8 mm).  A polypectomy was performed with a cold snarefor the 5 mm polyps and hot snare for the 8 mm polyp.  The resection was complete, the polyp tissue was completely retrieved and sent to histology.   There was mild diverticulosis in the sigmoid colon. Changes of portal hypertensive colopathy (mild) throughout. Prominent rectal veins without bleeding..   The examination was otherwise normal.  Retroflexed views revealed internal hemorrhoids. The time to cecum = 7.4 Withdrawal time = 14.3   The scope was withdrawn and the procedure completed. COMPLICATIONS: There were no immediate complications.  ENDOSCOPIC IMPRESSION: 1.   Three polyps were found in the  colon; polypectomy was performed with a cold  and hot snare 2.   Mild diverticulosis was noted in the sigmoid colon 3.   The examination was otherwise normal 4. Recurrent rectal bleeding secondary to external hemorrhoids  RECOMMENDATIONS: 1. High fiber diet 2. Anusol suppositories to be prescribed. Insert 1 each evening. My nurse will call this into your pharmacy 3. Surgical evaluation regarding possible hemorrhoidectomy should bleeding be refractory  eSigned:  Eustace Quail, MD 04/13/2015 11:10 AM   cc: Celedonio Savage MD and The Patient

## 2015-04-13 NOTE — Interval H&P Note (Signed)
History and Physical Interval Note:  04/13/2015 9:57 AM  Jessica French  has presented today for surgery, with the diagnosis of rectal bleeding  The various methods of treatment have been discussed with the patient and family. After consideration of risks, benefits and other options for treatment, the patient has consented to  Procedure(s): COLONOSCOPY (N/A) as a surgical intervention .  The patient's history has been reviewed, patient examined, no change in status, stable for surgery.  I have reviewed the patient's chart and labs.  Questions were answered to the patient's satisfaction.     Scarlette Shorts

## 2015-04-13 NOTE — Anesthesia Preprocedure Evaluation (Addendum)
Anesthesia Evaluation  Patient identified by MRN, date of birth, ID band Patient awake    Reviewed: Allergy & Precautions, NPO status , Patient's Chart, lab work & pertinent test results  History of Anesthesia Complications Negative for: history of anesthetic complications  Airway Mallampati: II  TM Distance: >3 FB Neck ROM: Full    Dental no notable dental hx. (+) Dental Advisory Given, Poor Dentition, Chipped, Missing   Pulmonary neg pulmonary ROS,    Pulmonary exam normal breath sounds clear to auscultation       Cardiovascular negative cardio ROS Normal cardiovascular exam Rhythm:Regular Rate:Normal     Neuro/Psych negative neurological ROS  negative psych ROS   GI/Hepatic negative GI ROS, Neg liver ROS, GERD  Medicated and Controlled,(+) Cirrhosis       ,   Endo/Other  Hypothyroidism   Renal/GU negative Renal ROS  negative genitourinary   Musculoskeletal  (+) Arthritis ,   Abdominal   Peds negative pediatric ROS (+)  Hematology negative hematology ROS (+)   Anesthesia Other Findings   Reproductive/Obstetrics negative OB ROS                            Anesthesia Physical Anesthesia Plan  ASA: III  Anesthesia Plan: MAC   Post-op Pain Management:    Induction: Intravenous  Airway Management Planned: Nasal Cannula  Additional Equipment:   Intra-op Plan:   Post-operative Plan:   Informed Consent: I have reviewed the patients History and Physical, chart, labs and discussed the procedure including the risks, benefits and alternatives for the proposed anesthesia with the patient or authorized representative who has indicated his/her understanding and acceptance.   Dental advisory given  Plan Discussed with: CRNA  Anesthesia Plan Comments:         Anesthesia Quick Evaluation

## 2015-04-13 NOTE — H&P (View-Only) (Signed)
HISTORY OF PRESENT ILLNESS:  Jessica French is a 82 y.o. female with hepatic cirrhosis secondary to [place], associated pancytopenia and hypothyroidism. She presents today with chief complaint of severe recurrent rectal bleeding. The patient was last evaluated in this office 12/16/2014 for loose stools and rectal bleeding. See that dictation. Rectal examination at that time was unremarkable with Hemoccult negative stool. CT scan of the abdomen and pelvis was obtained and found to be unremarkable with changes of advanced cirrhosis noted. Patient reports to me that she had multiple episodes of severe rectal bleeding last week. Did not seek medical attention. Continues to describe occasional loose stools with incontinence. Generally with normal stools and no incontinence. She is on multiple medications as listed. Outside blood work from her primary care physician's office, Dr. Celedonio Savage, has been reviewed. Most recent hemoglobin 10.7, white blood cell count 2.8, platelets 89,000. She is also followed by oncology/hematology for her pancytopenia. Prothrombin time from last year was normal. Last colonoscopy was in 2003 with Dr. Timmothy Euler. She was found to have 210 mm rectal polyps. One was a tubular adenoma the other tubulovillous adenoma. No follow-up since. GI review of systems is otherwise remarkable for nausea and gas  REVIEW OF SYSTEMS:  All non-GI ROS negative except for arthritis, confusion, itching, muscle cramps, hearing problems, fatigue, skin rash, sleeping problems, ankle edema, urinary frequency, urinary leakage, shortness of breath, voice change  Past Medical History  Diagnosis Date  . PBC (primary biliary cirrhosis) ([place])   . Hypothyroidism     Dx before age 13  . Hypocalcemia   . GERD (gastroesophageal reflux disease)   . Vitamin B12 deficiency   . Colon polyps     uncertain pathology  . Scoliosis   . Chronic low back pain   . Scoliosis   . Female bladder prolapse   . Anemia     Past  Surgical History  Procedure Laterality Date  . Bladder surgery      prolapse, multiple  . Abdominal hysterectomy    . Appendectomy      Social History Jessica French  reports that she has never smoked. She has never used smokeless tobacco. She reports that she does not drink alcohol or use illicit drugs.  family history includes Breast cancer in her maternal aunt; Diabetes in her father; Heart disease in her brother; Leukemia in her mother; Stomach cancer in her maternal grandmother.  Allergies  Allergen Reactions  . Tobramycin     EYE DROPS       PHYSICAL EXAMINATION: Vital signs: BP 140/56 mmHg  Pulse 76  Ht 5' 0.5" (1.537 m)  Wt 130 lb 2 oz (59.024 kg)  BMI 24.99 kg/m2  Constitutional: Elderly, thin, somewhat frail but otherwise generally well-appearing, no acute distress Psychiatric: alert and oriented x3, cooperative Eyes: extraocular movements intact, anicteric, conjunctiva pink Mouth: oral pharynx moist, no lesions Neck: supple without adenopathy or thyromegaly Lymph: no lymphadenopathy Cardiovascular: heart regular rate and rhythm, no murmur Lungs: clear to auscultation bilaterally Abdomen: soft, nontender, nondistended, no obvious ascites, no peritoneal signs, normal bowel sounds, no organomegaly Rectal: Deferred. Previous rectal exam unremarkable as outlined Extremities: no clubbing or cyanosis. 2+ lower extremity edema bilaterally Skin: no lesions on visible extremities Neuro: No focal deficits. Normal deep tendon reflexes. No asterixis.  ASSESSMENT:  #1. Recurrent rectal bleeding. Moderately severe as described. She could have internal hemorrhoids or rectal varices related to her portal hypertension. She does have known diverticular disease of this is less likely  the cause. Having had advanced neoplasia of the rectum, rule out recurrent neoplasia important. #2. Hepatic cirrhosis with portal hypertension secondary to [place] #3. Pancytopenia secondary to portal  hypertension #4. Intermittent fecal incontinence  PLAN:  #1. At this point I think it's prudent to move forward with colonoscopy given the recurrent and moderately severe nature of the bleeding. Patient is high-risk given her age and comorbidities. We will set of the examination at the hospital with MAC.The nature of the procedure, as well as the risks, benefits, and alternatives were carefully and thoroughly reviewed with the patient. Ample time for discussion and questions allowed. The patient understood, was satisfied, and agreed to proceed.

## 2015-04-13 NOTE — Transfer of Care (Signed)
Immediate Anesthesia Transfer of Care Note  Patient: Jessica French  Procedure(s) Performed: Procedure(s): COLONOSCOPY (N/A)  Patient Location: PACU  Anesthesia Type:MAC  Level of Consciousness:  sedated, patient cooperative and responds to stimulation  Airway & Oxygen Therapy:Patient Spontanous Breathing and Patient connected to face mask oxgen  Post-op Assessment:  Report given to PACU RN and Post -op Vital signs reviewed and stable  Post vital signs:  Reviewed and stable  Last Vitals:  Filed Vitals:   04/13/15 1011  BP: 154/38  Pulse: 81  Temp: 36.4 C  Resp: 18    Complications: No apparent anesthesia complications

## 2015-04-13 NOTE — Anesthesia Postprocedure Evaluation (Signed)
Anesthesia Post Note  Patient: Jessica French  Procedure(s) Performed: Procedure(s) (LRB): COLONOSCOPY (N/A)  Patient location during evaluation: PACU Anesthesia Type: MAC Level of consciousness: awake and alert Pain management: pain level controlled Vital Signs Assessment: post-procedure vital signs reviewed and stable Respiratory status: spontaneous breathing, nonlabored ventilation, respiratory function stable and patient connected to nasal cannula oxygen Cardiovascular status: blood pressure returned to baseline and stable Postop Assessment: no signs of nausea or vomiting Anesthetic complications: no    Last Vitals:  Filed Vitals:   04/13/15 1120 04/13/15 1130  BP: 111/32 108/33  Pulse: 79 80  Temp:    Resp: 12 27    Last Pain: There were no vitals filed for this visit.               Jazmeen Axtell JENNETTE

## 2015-04-13 NOTE — Discharge Instructions (Signed)

## 2015-04-14 ENCOUNTER — Telehealth: Payer: Self-pay | Admitting: Internal Medicine

## 2015-04-14 ENCOUNTER — Encounter (HOSPITAL_COMMUNITY): Payer: Self-pay | Admitting: Internal Medicine

## 2015-04-14 MED ORDER — HYDROCORTISONE 2.5 % RE CREA: 1.0000 "application " | TOPICAL_CREAM | Freq: Two times a day (BID) | RECTAL | Status: DC

## 2015-04-14 NOTE — Telephone Encounter (Signed)
Pt called back and needs her Ursodiol 90 day supply refilled

## 2015-04-14 NOTE — Telephone Encounter (Signed)
Called rx in to pharmacy for Anusol cream, which is only $3.  Spoke with patient's daughter and told her to have the patient try this and let me know if there were still any problems.  She agreed

## 2015-04-16 MED ORDER — VITAMIN E 180 MG (400 UNIT) PO CAPS: 400.0000 [IU] | ORAL_CAPSULE | Freq: Every day | ORAL | Status: AC

## 2015-04-16 NOTE — Telephone Encounter (Signed)
Ursodiol refilled.

## 2015-04-27 ENCOUNTER — Telehealth: Payer: Self-pay | Admitting: Internal Medicine

## 2015-04-27 NOTE — Telephone Encounter (Signed)
We have never managed her edema with diuretics. She should see her PCP regarding management of this issue

## 2015-04-27 NOTE — Telephone Encounter (Signed)
Spoke with pt and she is aware.

## 2015-04-27 NOTE — Telephone Encounter (Signed)
Pt had colon done 04/13/15. States that since procedure she has noticed her feet and ankles and abdomen are swollen. Pt has lasix on her med list but states she is not taking this. Asked pt when she stopped it and she does not remember. Please advise.

## 2015-05-10 ENCOUNTER — Encounter (HOSPITAL_COMMUNITY): Payer: Self-pay | Admitting: Emergency Medicine

## 2015-05-10 ENCOUNTER — Emergency Department (HOSPITAL_COMMUNITY): Payer: Medicare Other

## 2015-05-10 ENCOUNTER — Inpatient Hospital Stay (HOSPITAL_COMMUNITY)
Admission: EM | Admit: 2015-05-10 | Discharge: 2015-05-15 | DRG: 193 | Disposition: A | Discharge status: Home Health | Payer: Medicare Other | Attending: Internal Medicine | Admitting: Internal Medicine

## 2015-05-10 ENCOUNTER — Telehealth: Payer: Self-pay | Admitting: Internal Medicine

## 2015-05-10 DIAGNOSIS — D649 Anemia, unspecified: Secondary | ICD-10-CM | POA: Diagnosis present

## 2015-05-10 DIAGNOSIS — R0600 Dyspnea, unspecified: Secondary | ICD-10-CM

## 2015-05-10 DIAGNOSIS — Z79899 Other long term (current) drug therapy: Secondary | ICD-10-CM

## 2015-05-10 DIAGNOSIS — E538 Deficiency of other specified B group vitamins: Secondary | ICD-10-CM | POA: Diagnosis present

## 2015-05-10 DIAGNOSIS — J948 Other specified pleural conditions: Secondary | ICD-10-CM

## 2015-05-10 DIAGNOSIS — R601 Generalized edema: Secondary | ICD-10-CM | POA: Diagnosis not present

## 2015-05-10 DIAGNOSIS — G8929 Other chronic pain: Secondary | ICD-10-CM | POA: Diagnosis present

## 2015-05-10 DIAGNOSIS — E038 Other specified hypothyroidism: Secondary | ICD-10-CM

## 2015-05-10 DIAGNOSIS — E039 Hypothyroidism, unspecified: Secondary | ICD-10-CM | POA: Diagnosis present

## 2015-05-10 DIAGNOSIS — N183 Chronic kidney disease, stage 3 unspecified: Secondary | ICD-10-CM | POA: Diagnosis present

## 2015-05-10 DIAGNOSIS — J90 Pleural effusion, not elsewhere classified: Secondary | ICD-10-CM | POA: Diagnosis present

## 2015-05-10 DIAGNOSIS — D696 Thrombocytopenia, unspecified: Secondary | ICD-10-CM | POA: Diagnosis present

## 2015-05-10 DIAGNOSIS — I5033 Acute on chronic diastolic (congestive) heart failure: Secondary | ICD-10-CM | POA: Diagnosis not present

## 2015-05-10 DIAGNOSIS — M545 Low back pain: Secondary | ICD-10-CM | POA: Diagnosis present

## 2015-05-10 DIAGNOSIS — K743 Primary biliary cirrhosis: Secondary | ICD-10-CM | POA: Diagnosis present

## 2015-05-10 DIAGNOSIS — R140 Abdominal distension (gaseous): Secondary | ICD-10-CM

## 2015-05-10 DIAGNOSIS — K219 Gastro-esophageal reflux disease without esophagitis: Secondary | ICD-10-CM | POA: Diagnosis present

## 2015-05-10 DIAGNOSIS — J189 Pneumonia, unspecified organism: Secondary | ICD-10-CM | POA: Diagnosis present

## 2015-05-10 DIAGNOSIS — R05 Cough: Secondary | ICD-10-CM | POA: Diagnosis present

## 2015-05-10 LAB — BASIC METABOLIC PANEL
Anion gap: 12 (ref 5–15)
BUN: 35 mg/dL — AB (ref 6–20)
CALCIUM: 8.9 mg/dL (ref 8.9–10.3)
CHLORIDE: 108 mmol/L (ref 101–111)
CO2: 21 mmol/L — ABNORMAL LOW (ref 22–32)
CREATININE: 1.15 mg/dL — AB (ref 0.44–1.00)
GFR, EST AFRICAN AMERICAN: 48 mL/min — AB (ref >60)
GFR, EST NON AFRICAN AMERICAN: 42 mL/min — AB (ref >60)
Glucose, Bld: 100 mg/dL — ABNORMAL HIGH (ref 65–99)
Potassium: 4.1 mmol/L (ref 3.5–5.1)
SODIUM: 141 mmol/L (ref 135–145)

## 2015-05-10 LAB — URINALYSIS, ROUTINE W REFLEX MICROSCOPIC
Bilirubin Urine: NEGATIVE
GLUCOSE, UA: NEGATIVE mg/dL
KETONES UR: NEGATIVE mg/dL
Nitrite: NEGATIVE
PH: 5.5 (ref 5.0–8.0)
Protein, ur: NEGATIVE mg/dL
SPECIFIC GRAVITY, URINE: 1.01 (ref 1.005–1.030)

## 2015-05-10 LAB — HEPATIC FUNCTION PANEL
ALT: 21 U/L (ref 14–54)
AST: 35 U/L (ref 15–41)
Albumin: 2.8 g/dL — ABNORMAL LOW (ref 3.5–5.0)
Alkaline Phosphatase: 91 U/L (ref 38–126)
BILIRUBIN DIRECT: 0.2 mg/dL (ref 0.1–0.5)
BILIRUBIN TOTAL: 0.9 mg/dL (ref 0.3–1.2)
Indirect Bilirubin: 0.7 mg/dL (ref 0.3–0.9)
TOTAL PROTEIN: 5.7 g/dL — AB (ref 6.5–8.1)

## 2015-05-10 LAB — CBC
HCT: 32.3 % — ABNORMAL LOW (ref 36.0–46.0)
Hemoglobin: 10.5 g/dL — ABNORMAL LOW (ref 12.0–15.0)
MCH: 31.3 pg (ref 26.0–34.0)
MCHC: 32.5 g/dL (ref 30.0–36.0)
MCV: 96.1 fL (ref 78.0–100.0)
PLATELETS: 121 10*3/uL — AB (ref 150–400)
RBC: 3.36 MIL/uL — AB (ref 3.87–5.11)
RDW: 15 % (ref 11.5–15.5)
WBC: 4.9 10*3/uL (ref 4.0–10.5)

## 2015-05-10 LAB — I-STAT TROPONIN, ED: TROPONIN I, POC: 0 ng/mL (ref 0.00–0.08)

## 2015-05-10 LAB — BRAIN NATRIURETIC PEPTIDE: B NATRIURETIC PEPTIDE 5: 237.2 pg/mL — AB (ref 0.0–100.0)

## 2015-05-10 LAB — STREP PNEUMONIAE URINARY ANTIGEN: Strep Pneumo Urinary Antigen: NEGATIVE

## 2015-05-10 LAB — URINE MICROSCOPIC-ADD ON

## 2015-05-10 MED ORDER — FOLIC ACID 1 MG PO TABS
1.0000 mg | ORAL_TABLET | Freq: Every day | ORAL | Status: DC
  Administered 2015-05-11 – 2015-05-15 (×6): 1 mg via ORAL
  Filled 2015-05-10 (×6): qty 1

## 2015-05-10 MED ORDER — URSODIOL 300 MG PO CAPS
300.0000 mg | ORAL_CAPSULE | Freq: Three times a day (TID) | ORAL | Status: DC
  Administered 2015-05-11 – 2015-05-15 (×14): 300 mg via ORAL
  Filled 2015-05-10 (×18): qty 1

## 2015-05-10 MED ORDER — COENZYME Q10 30 MG PO CAPS: 30.0000 mg | ORAL_CAPSULE | Freq: Every day | ORAL | Status: DC

## 2015-05-10 MED ORDER — DEXTROSE 5 % IV SOLN
1.0000 g | INTRAVENOUS | Status: DC
  Administered 2015-05-11 – 2015-05-14 (×4): 1 g via INTRAVENOUS
  Filled 2015-05-10 (×5): qty 10

## 2015-05-10 MED ORDER — SODIUM CHLORIDE 0.9% FLUSH
3.0000 mL | INTRAVENOUS | Status: DC | PRN
  Administered 2015-05-14: 3 mL via INTRAVENOUS
  Filled 2015-05-10: qty 3

## 2015-05-10 MED ORDER — FERROUS SULFATE 325 (65 FE) MG PO TABS
325.0000 mg | ORAL_TABLET | Freq: Every day | ORAL | Status: DC
  Administered 2015-05-11 – 2015-05-15 (×5): 325 mg via ORAL
  Filled 2015-05-10 (×5): qty 1

## 2015-05-10 MED ORDER — LEVOTHYROXINE SODIUM 50 MCG PO TABS
50.0000 ug | ORAL_TABLET | Freq: Every day | ORAL | Status: DC
  Administered 2015-05-11 – 2015-05-15 (×5): 50 ug via ORAL
  Filled 2015-05-10 (×5): qty 1

## 2015-05-10 MED ORDER — FUROSEMIDE 10 MG/ML IJ SOLN: 40.0000 mg | Freq: Two times a day (BID) | INTRAMUSCULAR | Status: DC

## 2015-05-10 MED ORDER — AZITHROMYCIN 500 MG IV SOLR
500.0000 mg | INTRAVENOUS | Status: DC
  Administered 2015-05-11 – 2015-05-13 (×3): 500 mg via INTRAVENOUS
  Filled 2015-05-10 (×4): qty 500

## 2015-05-10 MED ORDER — ONDANSETRON HCL 4 MG/2ML IJ SOLN: 4.0000 mg | Freq: Four times a day (QID) | INTRAMUSCULAR | Status: DC | PRN

## 2015-05-10 MED ORDER — SODIUM CHLORIDE 0.9% FLUSH
3.0000 mL | Freq: Two times a day (BID) | INTRAVENOUS | Status: DC
  Administered 2015-05-11 – 2015-05-14 (×4): 3 mL via INTRAVENOUS

## 2015-05-10 MED ORDER — FLUTICASONE PROPIONATE 50 MCG/ACT NA SUSP
1.0000 | Freq: Every day | NASAL | Status: DC
  Administered 2015-05-11 – 2015-05-15 (×5): 1 via NASAL
  Filled 2015-05-10: qty 16

## 2015-05-10 MED ORDER — SODIUM CHLORIDE 0.9 % IV SOLN: 250.0000 mL | INTRAVENOUS | Status: DC | PRN

## 2015-05-10 MED ORDER — FUROSEMIDE 10 MG/ML IJ SOLN
40.0000 mg | Freq: Once | INTRAMUSCULAR | Status: AC
  Administered 2015-05-10: 40 mg via INTRAVENOUS
  Filled 2015-05-10: qty 4

## 2015-05-10 MED ORDER — ADULT MULTIVITAMIN W/MINERALS CH
1.0000 | ORAL_TABLET | Freq: Every day | ORAL | Status: DC
  Administered 2015-05-11 – 2015-05-15 (×5): 1 via ORAL
  Filled 2015-05-10 (×5): qty 1

## 2015-05-10 MED ORDER — CALCIUM CARBONATE 1250 (500 CA) MG PO TABS
1250.0000 mg | ORAL_TABLET | Freq: Every day | ORAL | Status: DC
  Administered 2015-05-11 – 2015-05-15 (×5): 1250 mg via ORAL
  Filled 2015-05-10 (×5): qty 1

## 2015-05-10 MED ORDER — DEXTROSE 5 % IV SOLN
1.0000 g | Freq: Once | INTRAVENOUS | Status: AC
  Administered 2015-05-10: 1 g via INTRAVENOUS
  Filled 2015-05-10: qty 10

## 2015-05-10 MED ORDER — ACETAMINOPHEN 325 MG PO TABS
650.0000 mg | ORAL_TABLET | ORAL | Status: DC | PRN
  Administered 2015-05-11: 650 mg via ORAL
  Filled 2015-05-10: qty 2

## 2015-05-10 MED ORDER — HYPROMELLOSE (GONIOSCOPIC) 2.5 % OP SOLN
1.0000 [drp] | Freq: Three times a day (TID) | OPHTHALMIC | Status: DC | PRN
  Filled 2015-05-10: qty 15

## 2015-05-10 MED ORDER — ZINC SULFATE 220 (50 ZN) MG PO CAPS
220.0000 mg | ORAL_CAPSULE | Freq: Every day | ORAL | Status: DC
  Administered 2015-05-11 – 2015-05-15 (×5): 220 mg via ORAL
  Filled 2015-05-10 (×8): qty 1

## 2015-05-10 MED ORDER — VITAMIN E 180 MG (400 UNIT) PO CAPS
400.0000 [IU] | ORAL_CAPSULE | Freq: Every day | ORAL | Status: DC
  Administered 2015-05-11 – 2015-05-15 (×5): 400 [IU] via ORAL
  Filled 2015-05-10 (×8): qty 1

## 2015-05-10 MED ORDER — CALCIUM CARBONATE ANTACID 500 MG PO CHEW: 1.0000 | CHEWABLE_TABLET | ORAL | Status: DC | PRN

## 2015-05-10 MED ORDER — HEPARIN SODIUM (PORCINE) 5000 UNIT/ML IJ SOLN
5000.0000 [IU] | Freq: Three times a day (TID) | INTRAMUSCULAR | Status: DC
  Administered 2015-05-11 – 2015-05-15 (×13): 5000 [IU] via SUBCUTANEOUS
  Filled 2015-05-10 (×12): qty 1

## 2015-05-10 MED ORDER — DEXTROSE 5 % IV SOLN
500.0000 mg | Freq: Once | INTRAVENOUS | Status: AC
  Administered 2015-05-10: 500 mg via INTRAVENOUS
  Filled 2015-05-10: qty 500

## 2015-05-10 NOTE — Telephone Encounter (Signed)
Left message for pt to call back  °

## 2015-05-10 NOTE — ED Provider Notes (Signed)
CSN: [id number]     Arrival date & time 05/10/15  1446 History   First MD Initiated Contact with Patient 05/10/15 1921     Chief Complaint  Patient presents with  . Pneumonia     (Consider location/radiation/quality/duration/timing/severity/associated sxs/prior Treatment) HPI  82 year old female presents with overall weakness and fatigue, cough, and leg and abdomen swelling. All of the symptoms seemed to start about one month ago but have progressively worsened. One week ago was started on antibiotic that she does not know the name of for pneumonia seen on x-ray. Has also been put on a fluid pill and then changed to a "stronger fluid pill" with no relief. Swelling seems to be worsening. She states this all started after a colonoscopy. She has a history of primary biliary cirrhosis and family is concerned this is causing her fluid problem. No fevers. No chest pain or abdominal pain.  Past Medical History  Diagnosis Date  . PBC (primary biliary cirrhosis) ([place])   . Hypothyroidism     Dx before age 73  . Hypocalcemia   . GERD (gastroesophageal reflux disease)   . Vitamin B12 deficiency   . Colon polyps     uncertain pathology  . Scoliosis   . Chronic low back pain   . Scoliosis   . Female bladder prolapse   . Anemia   . Arthritis    Past Surgical History  Procedure Laterality Date  . Bladder surgery      prolapse, multiple  . Abdominal hysterectomy    . Appendectomy    . Colonoscopy N/A 04/13/2015    Procedure: COLONOSCOPY;  Surgeon: Irene Shipper, MD;  Location: [facility];  Service: Endoscopy;  Laterality: N/A;   Family History  Problem Relation Age of Onset  . Heart disease Brother     died  . Leukemia Mother   . Diabetes Father   . Stomach cancer Maternal Grandmother   . Breast cancer Maternal Aunt   . Liver disease     Social History  Substance Use Topics  . Smoking status: Never Smoker   . Smokeless tobacco: Never Used  . Alcohol Use: Yes     Comment: social  occasional   OB History    No data available     Review of Systems  Constitutional: Negative for fever.  Respiratory: Positive for cough and shortness of breath.   Cardiovascular: Positive for leg swelling. Negative for chest pain.  Gastrointestinal: Positive for abdominal distention. Negative for abdominal pain.  All other systems reviewed and are negative.     Allergies  Tobramycin  Home Medications   Prior to Admission medications   Medication Sig Start Date End Date Taking? Authorizing Provider  Calcium Carbonate (CALCIUM 600 PO) Take 1 tablet by mouth daily.   Yes Historical Provider, MD  calcium carbonate (TUMS - DOSED IN MG ELEMENTAL CALCIUM) 500 MG chewable tablet Chew 1 tablet by mouth as needed for indigestion or heartburn.    Yes Historical Provider, MD  Cyanocobalamin (VITAMIN B-12 IJ) Inject 1,000 mcg as directed every 30 (thirty) days.   Yes Historical Provider, MD  carboxymethylcellulose (REFRESH PLUS) 0.5 % SOLN Place 1 drop into both eyes 3 (three) times daily as needed (dry eyes).     Historical Provider, MD  co-enzyme Q-10 30 MG capsule Take 30 mg by mouth daily.    Historical Provider, MD  Cyanocobalamin (VITAMIN B-12) 1000 MCG SUBL Place 1,000 mcg under the tongue every 30 (thirty) days. Reported on 05/10/2015  Historical Provider, MD  ferrous sulfate 325 (65 FE) MG tablet Take 325 mg by mouth daily with breakfast.    Historical Provider, MD  folic acid (FOLVITE) 1 MG tablet Take 1 mg by mouth daily.    Historical Provider, MD  furosemide (LASIX) 20 MG tablet Take 20 mg by mouth daily.     Historical Provider, MD  hydrocortisone (ANUSOL-HC) 2.5 % rectal cream Place 1 application rectally 2 (two) times daily. 04/14/15   Irene Shipper, MD  hydrocortisone (ANUSOL-HC) 25 MG suppository Place 1 suppository (25 mg total) rectally at bedtime. 04/13/15   Irene Shipper, MD  levothyroxine (SYNTHROID, LEVOTHROID) 50 MCG tablet Take 50 mcg by mouth daily before breakfast.     Historical Provider, MD  MAGNESIUM PO Take 1 tablet by mouth daily.    Historical Provider, MD  MILK THISTLE PO Take 1 tablet by mouth 2 (two) times daily.     Historical Provider, MD  Misc Natural Products (OSTEO BI-FLEX ADV DOUBLE ST) CAPS Take 1 capsule by mouth daily.    Historical Provider, MD  mometasone (NASONEX) 50 MCG/ACT nasal spray Place 1 spray into the nose at bedtime.     Historical Provider, MD  Multiple Vitamins-Minerals (CENTRUM SILVER PO) Take 1 tablet by mouth daily.    Historical Provider, MD  Multiple Vitamins-Minerals (PRESERVISION AREDS 2) CAPS Take 1 capsule by mouth daily.    Historical Provider, MD  Na Sulfate-K Sulfate-Mg Sulf SOLN Take 1 kit by mouth once. 03/24/15   Irene Shipper, MD  naproxen sodium (ALEVE) 220 MG tablet Take 220 mg by mouth 2 (two) times daily.     Historical Provider, MD  triamcinolone cream (KENALOG) 0.1 % Apply 1 application topically daily as needed. Itching. 03/16/15   Historical Provider, MD  ursodiol (ACTIGALL) 300 MG capsule Take 1 capsule (300 mg total) by mouth 3 (three) times daily. 12/30/14   Irene Shipper, MD  vitamin E 400 UNIT capsule Take 1 capsule (400 Units total) by mouth daily. 04/16/15   Irene Shipper, MD  Zinc 50 MG CAPS Take 1 capsule by mouth daily.    Historical Provider, MD   BP 120/54 mmHg  Pulse 67  Temp(Src) 98.9 F (37.2 C) (Oral)  Resp 16  Wt 157 lb (71.215 kg)  SpO2 100% Physical Exam  Constitutional: She is oriented to person, place, and time. She appears well-developed and well-nourished.  HENT:  Head: Normocephalic and atraumatic.  Right Ear: External ear normal.  Left Ear: External ear normal.  Nose: Nose normal.  Eyes: Right eye exhibits no discharge. Left eye exhibits no discharge.  Cardiovascular: Normal rate, regular rhythm and normal heart sounds.   Pulmonary/Chest: Effort normal and breath sounds normal.  Abdominal: Soft. There is no tenderness.  Musculoskeletal: She exhibits edema (bilateral pitting  edema to lower extremities).  Neurological: She is alert and oriented to person, place, and time.  Skin: Skin is warm and dry.  Nursing note and vitals reviewed.   ED Course  Procedures (including critical care time) Labs Review Labs Reviewed  BASIC METABOLIC PANEL - Abnormal; Notable for the following:    CO2 21 (*)    Glucose, Bld 100 (*)    BUN 35 (*)    Creatinine, Ser 1.15 (*)    GFR calc non Af Amer 42 (*)    GFR calc Af Amer 48 (*)    All other components within normal limits  CBC - Abnormal; Notable for the following:  RBC 3.36 (*)    Hemoglobin 10.5 (*)    HCT 32.3 (*)    Platelets 121 (*)    All other components within normal limits  BRAIN NATRIURETIC PEPTIDE - Abnormal; Notable for the following:    B Natriuretic Peptide 237.2 (*)    All other components within normal limits  CULTURE, BLOOD (ROUTINE X 2)  CULTURE, BLOOD (ROUTINE X 2)  HEPATIC FUNCTION PANEL  URINALYSIS, ROUTINE W REFLEX MICROSCOPIC (NOT AT [facility])  Randolm Idol, ED    Imaging Review Dg Chest 2 View  05/10/2015  CLINICAL DATA:  Shortness of breath, cough and right-sided pain for 2 weeks. EXAM: CHEST  2 VIEW COMPARISON:  None. FINDINGS: Trachea is midline. Heart size is accentuated by low lung volumes. There is right basilar collapse/ consolidation with a moderate right pleural effusion. Left lung is grossly clear. IMPRESSION: Moderate right pleural effusion with collapse/ consolidation at the right lung base. Followup PA and lateral chest X-ray is recommended in 3-4 weeks following trial of antibiotic therapy to ensure resolution and exclude underlying malignancy. Electronically Signed   By: Lorin Picket M.D.   On: 05/10/2015 16:48   I have personally reviewed and evaluated these images and lab results as part of my medical decision-making.   EKG Interpretation   Date/Time:  Monday May 10 2015 16:19:03 EST Ventricular Rate:  65 PR Interval:  146 QRS Duration: 72 QT Interval:   408 QTC Calculation: 424 R Axis:   50 Text Interpretation:  Normal sinus rhythm Low voltage QRS Septal infarct ,  age undetermined Abnormal ECG nonspecific ST/T waves No old tracing to  compare Confirmed by Dora Simeone  MD, Galilea Quito ([id number]) on 05/10/2015 7:22:14 PM      MDM   Final diagnoses:  Dyspnea  Pleural effusion CAP  Patient does not appear in respiratory distress. However she does have a sizable pleural effusion with possible pneumonia. Has been on outpatient antibiotics without relief. Will treat with IV antibiotics but I think fluid overload is a large component of her symptoms. She does not have a significantly elevated BNP but she has significant bilateral lower extremity pitting edema as well as this pleural effusion. Will start on IV Lasix while also treating with antibiotics. Depending on her improvement she may need the pleural effusion tapped, but this point she is not hypoxic or in respiratory distress to suggest an emergent need. Admit to the hospitalist.    Sherwood Gambler, MD 05/11/15 [phone]

## 2015-05-10 NOTE — Telephone Encounter (Signed)
Noted  

## 2015-05-10 NOTE — ED Notes (Signed)
Pt sts currently being treated for PNA; pt sts increased fluid in legs and abd x 4 weeks; pt sts gen weakness and back pain

## 2015-05-10 NOTE — H&P (Signed)
[facility] History and Physical  Jessica French:[id number] DOB: Oct 31, 1926 DOA: 05/10/2015  Referring physician: ED physician PCP: Celedonio Savage, MD  Specialists:  Dr. Henrene Pastor, gastroenterology   Chief Complaint:   HPI: Jessica French is a 82 y.o. female with PMH of primary biliary cirrhosis, hypothyroidism, and chronic low back pain who presents to the ED with 1 month of progressive lower extremity and abdominal swelling with cough and malaise. Patient reports noting the development of bilateral lower extremity edema approximately 1 month ago and was placed on Lasix by her PCP. Has edema continued to progress, she reports that the Lasix dose was increased, but without any appreciable effect. Since that time, patient reports lower extremity edema to the hips as well as swelling of her abdomen. She notes concomitant development of generalized weakness, malaise, and nonproductive cough. She was evaluated by her primary care physician approximately one week ago with x-ray and placed on an antibiotic given radiographic evidence of pneumonia. Cough, weakness, and malaise continued to progress despite the antibiotic and when she was evaluated by her PCP again today, he directed her to the ED for further evaluation.   ED, patient was found to be afebrile, saturating adequately on room air, and with vital signs stable. Chest x-ray is significant for a moderate right-sided pleural effusion with associated consolidation at the right base concerning for pneumonia. Initial blood work returns notable for a hemoglobin of 10.5, platelet count of 121,000, and BNP of 240. Blood cultures were obtained in the ED and the patient was treated empirically with Rocephin and azithromycin. There was concern for volume overload and a single dose of 40 mg IV Lasix was administered. Patient remained hemodynamically stable in the emergency department and the hospitalists were called upon to admit for ongoing evaluation and management  of suspected CAP and anasarca of unknown etiology.   Where does patient live?   At home     Can patient participate in ADLs?  Yes        Review of Systems:   General: no fevers, chills, sweats, or poor appetite. Weight gain, fatigue, malaise HEENT: no blurry vision, hearing changes or sore throat Pulm: no dyspnea or wheeze. Non-productive cough CV: no chest pain or palpitations Abd: no vomiting, abdominal pain, diarrhea, or constipation. Nausea GU: no dysuria, hematuria, increased urinary frequency, or urgency  Ext: Progressive b/l LE edema Neuro: no focal weakness, numbness, or tingling, no vision change or hearing loss Skin: no rash, no wounds MSK: No muscle spasm, no deformity, no red, hot, or swollen joint Heme: No easy bruising or bleeding Travel history: No recent long distant travel    Allergy:  Allergies  Allergen Reactions  . Tobramycin     EYE DROPS--"burning like fire"    Past Medical History  Diagnosis Date  . PBC (primary biliary cirrhosis) ([place])   . Hypothyroidism     Dx before age 73  . Hypocalcemia   . GERD (gastroesophageal reflux disease)   . Vitamin B12 deficiency   . Colon polyps     uncertain pathology  . Scoliosis   . Chronic low back pain   . Scoliosis   . Female bladder prolapse   . Anemia   . Arthritis     Past Surgical History  Procedure Laterality Date  . Bladder surgery      prolapse, multiple  . Abdominal hysterectomy    . Appendectomy    . Colonoscopy N/A 04/13/2015    Procedure: COLONOSCOPY;  Surgeon:  Irene Shipper, MD;  Location: [facility];  Service: Endoscopy;  Laterality: N/A;    Social History:  reports that she has never smoked. She has never used smokeless tobacco. She reports that she drinks alcohol. She reports that she does not use illicit drugs.  Family History:  Family History  Problem Relation Age of Onset  . Heart disease Brother     died  . Leukemia Mother   . Diabetes Father   . Stomach cancer Maternal  Grandmother   . Breast cancer Maternal Aunt   . Liver disease       Prior to Admission medications   Medication Sig Start Date End Date Taking? Authorizing Provider  Calcium Carbonate (CALCIUM 600 PO) Take 1 tablet by mouth daily.   Yes Historical Provider, MD  calcium carbonate (TUMS - DOSED IN MG ELEMENTAL CALCIUM) 500 MG chewable tablet Chew 1 tablet by mouth as needed for indigestion or heartburn.    Yes Historical Provider, MD  Cyanocobalamin (VITAMIN B-12 IJ) Inject 1,000 mcg as directed every 30 (thirty) days.   Yes Historical Provider, MD  carboxymethylcellulose (REFRESH PLUS) 0.5 % SOLN Place 1 drop into both eyes 3 (three) times daily as needed (dry eyes).     Historical Provider, MD  co-enzyme Q-10 30 MG capsule Take 30 mg by mouth daily.    Historical Provider, MD  Cyanocobalamin (VITAMIN B-12) 1000 MCG SUBL Place 1,000 mcg under the tongue every 30 (thirty) days. Reported on 05/10/2015    Historical Provider, MD  ferrous sulfate 325 (65 FE) MG tablet Take 325 mg by mouth daily with breakfast.    Historical Provider, MD  folic acid (FOLVITE) 1 MG tablet Take 1 mg by mouth daily.    Historical Provider, MD  furosemide (LASIX) 20 MG tablet Take 20 mg by mouth daily.     Historical Provider, MD  hydrocortisone (ANUSOL-HC) 2.5 % rectal cream Place 1 application rectally 2 (two) times daily. 04/14/15   Irene Shipper, MD  hydrocortisone (ANUSOL-HC) 25 MG suppository Place 1 suppository (25 mg total) rectally at bedtime. 04/13/15   Irene Shipper, MD  levothyroxine (SYNTHROID, LEVOTHROID) 50 MCG tablet Take 50 mcg by mouth daily before breakfast.    Historical Provider, MD  MAGNESIUM PO Take 1 tablet by mouth daily.    Historical Provider, MD  MILK THISTLE PO Take 1 tablet by mouth 2 (two) times daily.     Historical Provider, MD  Misc Natural Products (OSTEO BI-FLEX ADV DOUBLE ST) CAPS Take 1 capsule by mouth daily.    Historical Provider, MD  mometasone (NASONEX) 50 MCG/ACT nasal spray Place 1  spray into the nose at bedtime.     Historical Provider, MD  Multiple Vitamins-Minerals (CENTRUM SILVER PO) Take 1 tablet by mouth daily.    Historical Provider, MD  Multiple Vitamins-Minerals (PRESERVISION AREDS 2) CAPS Take 1 capsule by mouth daily.    Historical Provider, MD  Na Sulfate-K Sulfate-Mg Sulf SOLN Take 1 kit by mouth once. 03/24/15   Irene Shipper, MD  naproxen sodium (ALEVE) 220 MG tablet Take 220 mg by mouth 2 (two) times daily.     Historical Provider, MD  triamcinolone cream (KENALOG) 0.1 % Apply 1 application topically daily as needed. Itching. 03/16/15   Historical Provider, MD  ursodiol (ACTIGALL) 300 MG capsule Take 1 capsule (300 mg total) by mouth 3 (three) times daily. 12/30/14   Irene Shipper, MD  vitamin E 400 UNIT capsule Take 1 capsule (400  Units total) by mouth daily. 04/16/15   Irene Shipper, MD  Zinc 50 MG CAPS Take 1 capsule by mouth daily.    Historical Provider, MD    Physical Exam: Filed Vitals:   05/10/15 1930 05/10/15 1945 05/10/15 2000 05/10/15 2030  BP: 134/74 122/71 128/54 120/54  Pulse: 72 65 68 67  Temp:      TempSrc:      Resp: '24 24 22 16  '$ Weight:      SpO2: 99% 100% 100% 100%   General:  In mild respiratory distress with accessory muscle use  HEENT:       Eyes: PERRL, EOMI, no scleral icterus or conjunctival pallor.       ENT: No discharge from the ears or nose, no pharyngeal ulcers, petechiae or exudate.        Neck: No JVD, no bruit, no appreciable mass Heme: No cervical adenopathy, no pallor Cardiac: S1/S2, RRR, No murmurs, No gallops or rubs. Pulm: markedly diminished over lower right lung fields. Coarse ronchi at right apex, lower left lung fields Abd: Soft, mild distension, nontender, no rebound pain or gaurding, no mass or organomegaly, BS present. Ext: Bilateral LEs taught with edema to the hips. Stasis dermatitis b/l. Musculoskeletal: No gross deformity, no red, hot, swollen joints  Skin: No rashes or wounds on exposed surfaces  Neuro:  Alert, oriented X3, cranial nerves II-XII grossly intact. No focal findings Psych: Patient is not overtly psychotic, appropriate mood and affect.  Labs on Admission:  Basic Metabolic Panel:  Recent Labs Lab 05/10/15 1622  NA 141  K 4.1  CL 108  CO2 21*  GLUCOSE 100*  BUN 35*  CREATININE 1.15*  CALCIUM 8.9   Liver Function Tests:  Recent Labs Lab 05/10/15 2015  AST 35  ALT 21  ALKPHOS 91  BILITOT 0.9  PROT 5.7*  ALBUMIN 2.8*   No results for input(s): LIPASE, AMYLASE in the last 168 hours. No results for input(s): AMMONIA in the last 168 hours. CBC:  Recent Labs Lab 05/10/15 1622  WBC 4.9  HGB 10.5*  HCT 32.3*  MCV 96.1  PLT 121*   Cardiac Enzymes: No results for input(s): CKTOTAL, CKMB, CKMBINDEX, TROPONINI in the last 168 hours.  BNP (last 3 results)  Recent Labs  05/10/15 1636  BNP 237.2*    ProBNP (last 3 results) No results for input(s): PROBNP in the last 8760 hours.  CBG: No results for input(s): GLUCAP in the last 168 hours.  Radiological Exams on Admission: Dg Chest 2 View  05/10/2015  CLINICAL DATA:  Shortness of breath, cough and right-sided pain for 2 weeks. EXAM: CHEST  2 VIEW COMPARISON:  None. FINDINGS: Trachea is midline. Heart size is accentuated by low lung volumes. There is right basilar collapse/ consolidation with a moderate right pleural effusion. Left lung is grossly clear. IMPRESSION: Moderate right pleural effusion with collapse/ consolidation at the right lung base. Followup PA and lateral chest X-ray is recommended in 3-4 weeks following trial of antibiotic therapy to ensure resolution and exclude underlying malignancy. Electronically Signed   By: Lorin Picket M.D.   On: 05/10/2015 16:48    EKG: Independently reviewed.  Abnormal findings:  Normal sinus rhythm, low-voltage QRS, septal Q-waves  Assessment/Plan  1. CAP, right pleural effusion   - Failed outp treatment with PO abx  - Blood cultures drawn in ED and empiric  Rocephin and azithromycin initiated  - Sputum culture and gram stain requested  - Continue empiric treatment with  Rocephin and azithro while awaiting culture data  - Trend lactate, procalcitonin  - Check urine for strep pneumo, Legionella antigens  - Titrate FiO2 to maintain sat >00%  - Uncertain whether pleural effusion is parapneumonic or related to apparent volume overload; IR thoracentesis requested for diagnostic and therapeutic purposes  2. Anasarca  - Has progressed despite being placed on PO Lasix, and having Lasix dose increased  - Pt reports >10 lb wt gain in last 2 weeks  - Lasix 40 mg IV given x1 in ED  - SLIV, daily wts, strict I/Os, fluid-restrict diet  - Continue Lasix 40 mg IV BID, adjust prn  - Evaluate for cardiac etiology with TTE, pending    3. CKD stage III  - SCr 1.15 on admission, appears to be consistent with her baseline  - May bump with diuresis, but hopefully by improving volume status, CO (and renal perfusion) will increase  - Avoid nephrotoxins where feasible  - As above, following strict I/Os, daily wts, SLIV, fluid-restrict diet   4. Hypothyroidism  - Appears to be stable  - Continue home dose Synthroid 50 mcg qD   5. Primary biliary cirrhosis  - Followed by Dr. Henrene Pastor in outpt setting  - LFTs added on to admission chem panel and pending  - Continue home-dose Ursodiol  - Monitor   6. Anemia, thrombocytopenia  - Hgb 10.5 on admission, down from prior value in our EMR of 11.8 in 2015  - Platelet count 121,000 on admission, consistent with prior measurement - No sign of active blood loss  - Monitor, continue iron and B12 supplementation    DVT ppx: SQ Heparin     Code Status: Full code Family Communication: None at bed side.       Disposition Plan: Admit to inpatient   Date of Service 05/10/2015    Vianne Bulls, MD [facility] Pager [phone]  If 7PM-7AM, please contact night-coverage [email or web address] Password [email or web address] 05/10/2015, 9:52  PM

## 2015-05-10 NOTE — Telephone Encounter (Signed)
Pts daughter states that since her mother had colon done she has been having problems with fluid retention. She has seen her PCP and was placed on lasix but daughter reports she has been getting worse. She reports that her mother has gained about 10 pounds and that her abdomen is very swollen. They have discussed with this her PCP and are going to take her to the ER, state she may have pneumonia. They are concerned that some symptoms she is having are signs of her primary biliary cirrhosis getting worse. Discussed with daughter that it sounded like she did need to be evaluated and that if the ER thought GI needed to be involved they would ask for a consult. Daughter verbalized understanding. Dr. Henrene Pastor notified.

## 2015-05-11 ENCOUNTER — Inpatient Hospital Stay (HOSPITAL_COMMUNITY): Payer: Medicare Other

## 2015-05-11 ENCOUNTER — Ambulatory Visit (HOSPITAL_COMMUNITY): Payer: Medicare Other

## 2015-05-11 DIAGNOSIS — R0600 Dyspnea, unspecified: Secondary | ICD-10-CM

## 2015-05-11 LAB — CBC WITH DIFFERENTIAL/PLATELET
BASOS PCT: 0 %
Basophils Absolute: 0 10*3/uL (ref 0.0–0.1)
EOS ABS: 0.2 10*3/uL (ref 0.0–0.7)
Eosinophils Relative: 4 %
HCT: 29.9 % — ABNORMAL LOW (ref 36.0–46.0)
HEMOGLOBIN: 10.3 g/dL — AB (ref 12.0–15.0)
Lymphocytes Relative: 30 %
Lymphs Abs: 1.3 10*3/uL (ref 0.7–4.0)
MCH: 32.8 pg (ref 26.0–34.0)
MCHC: 34.4 g/dL (ref 30.0–36.0)
MCV: 95.2 fL (ref 78.0–100.0)
MONOS PCT: 18 %
Monocytes Absolute: 0.8 10*3/uL (ref 0.1–1.0)
NEUTROS PCT: 48 %
Neutro Abs: 2.2 10*3/uL (ref 1.7–7.7)
Platelets: 104 10*3/uL — ABNORMAL LOW (ref 150–400)
RBC: 3.14 MIL/uL — ABNORMAL LOW (ref 3.87–5.11)
RDW: 15 % (ref 11.5–15.5)
WBC: 4.5 10*3/uL (ref 4.0–10.5)

## 2015-05-11 LAB — COMPREHENSIVE METABOLIC PANEL
ALK PHOS: 103 U/L (ref 38–126)
ALT: 20 U/L (ref 14–54)
AST: 31 U/L (ref 15–41)
Albumin: 2.7 g/dL — ABNORMAL LOW (ref 3.5–5.0)
Anion gap: 9 (ref 5–15)
BUN: 35 mg/dL — ABNORMAL HIGH (ref 6–20)
CALCIUM: 8.6 mg/dL — AB (ref 8.9–10.3)
CO2: 25 mmol/L (ref 22–32)
CREATININE: 1.24 mg/dL — AB (ref 0.44–1.00)
Chloride: 107 mmol/L (ref 101–111)
GFR, EST AFRICAN AMERICAN: 44 mL/min — AB (ref >60)
GFR, EST NON AFRICAN AMERICAN: 38 mL/min — AB (ref >60)
Glucose, Bld: 120 mg/dL — ABNORMAL HIGH (ref 65–99)
Potassium: 3.8 mmol/L (ref 3.5–5.1)
Sodium: 141 mmol/L (ref 135–145)
Total Bilirubin: 0.7 mg/dL (ref 0.3–1.2)
Total Protein: 5.1 g/dL — ABNORMAL LOW (ref 6.5–8.1)

## 2015-05-11 LAB — BODY FLUID CELL COUNT WITH DIFFERENTIAL
Lymphs, Fluid: 57 %
Monocyte-Macrophage-Serous Fluid: 41 % — ABNORMAL LOW (ref 50–90)
Neutrophil Count, Fluid: 2 % (ref 0–25)
Total Nucleated Cell Count, Fluid: 1550 cu mm — ABNORMAL HIGH (ref 0–1000)

## 2015-05-11 LAB — GRAM STAIN

## 2015-05-11 LAB — PROTEIN, BODY FLUID

## 2015-05-11 LAB — HIV ANTIBODY (ROUTINE TESTING W REFLEX): HIV Screen 4th Generation wRfx: NONREACTIVE

## 2015-05-11 LAB — PROTIME-INR
INR: 1.3 (ref 0.00–1.49)
PROTHROMBIN TIME: 16.3 s — AB (ref 11.6–15.2)

## 2015-05-11 LAB — LACTIC ACID, PLASMA
Lactic Acid, Venous: 1.3 mmol/L (ref 0.5–2.0)
Lactic Acid, Venous: 1.2 mmol/L (ref 0.5–2.0)

## 2015-05-11 LAB — TSH: TSH: 7.687 u[IU]/mL — ABNORMAL HIGH (ref 0.350–4.500)

## 2015-05-11 LAB — LACTATE DEHYDROGENASE, PLEURAL OR PERITONEAL FLUID: LD, Fluid: 64 U/L — ABNORMAL HIGH (ref 3–23)

## 2015-05-11 LAB — MAGNESIUM: Magnesium: 1.9 mg/dL (ref 1.7–2.4)

## 2015-05-11 LAB — GLUCOSE, SEROUS FLUID: GLUCOSE FL: 109 mg/dL

## 2015-05-11 LAB — LEGIONELLA ANTIGEN, URINE

## 2015-05-11 LAB — PROCALCITONIN

## 2015-05-11 MED ORDER — LIDOCAINE HCL (PF) 1 % IJ SOLN
INTRAMUSCULAR | Status: AC
  Filled 2015-05-11: qty 10

## 2015-05-11 MED ORDER — FUROSEMIDE 10 MG/ML IJ SOLN
40.0000 mg | Freq: Two times a day (BID) | INTRAMUSCULAR | Status: DC
  Administered 2015-05-11 – 2015-05-13 (×5): 40 mg via INTRAVENOUS
  Filled 2015-05-11 (×5): qty 4

## 2015-05-11 NOTE — Procedures (Signed)
   US guided Rt thoracentesis 350 cc cloudy fluid obtained  Sent for labs per MD  Pt tolerated well cxr pending

## 2015-05-11 NOTE — Progress Notes (Signed)
Utilization review completed. Baylin Gamblin, RN, BSN. 

## 2015-05-11 NOTE — Progress Notes (Signed)
[facility]                                                                              Patient Demographics  Jessica French, is a 82 y.o. female, DOB - Aug 16, 1926, [id number]  Admit date - 05/10/2015   Admitting Physician Vianne Bulls, MD  Outpatient Primary MD for the patient is Celedonio Savage, MD  LOS - 1   Chief Complaint  Patient presents with  . Pneumonia      Interim history  82 year old female with history of primary biliary cirrhosis, hypothyroidism, presented to emergency department with a one-month progressive lower extremity and abdominal swelling along with cough and fatigue. Patient was recently placed on Lasix by her primary doctor however without resolution of her symptoms. Patient being treated for pneumonia as well as anasarca. Echocardiogram pending. IR consulted for thoracentesis.  Assessment & Plan   Community acquired pneumonia -Failed outpatient treatment  -Chest x-ray showed moderate right pleural effusion with collapse/consolidation at the right lung base, recommended repeat x-rays in 3-4 weeks -Pending blood and sputum cultures -Pending strep pneumonia and legionella urine antigens -Continue Rocephin and azithromycin  Right pleural effusion -Interventional radiology consulted and appreciated -Status post ultrasound-guided right thoracentesis, yielding 350 mL of cloudy fluid -Pending laboratory studies  Anasarca/lower extremity edema -Despite being on oral Lasix, swelling has not improved -Continue IV Lasix -Monitor intake and output, daily weight -Echocardiogram pending  Chronic kidney disease, stage III -currently stable -Continue to monitor closely due to diuresis  Hypothyroidism -Continue Synthroid  Primary biliary cirrhosis -Followed by Dr. Henrene Pastor -Continue ursodiol  Anemia/thombocytopenia -Continue monitor CBC -No signs of blood loss at this time  Code Status: Full  Family Communication: None at bedside  Disposition Plan:  Admitted  Time Spent in minutes   30 minutes  Procedures  US guided thoracentesis Echocardiogram  Consults   Interventional radiology  DVT Prophylaxis  SCDs  Lab Results  Component Value Date   PLT 104* 05/11/2015    Medications  Scheduled Meds: . azithromycin  500 mg Intravenous Q24H  . calcium carbonate  1,250 mg Oral Q breakfast  . cefTRIAXone (ROCEPHIN)  IV  1 g Intravenous Q24H  . ferrous sulfate  325 mg Oral Q breakfast  . fluticasone  1 spray Each Nare Daily  . folic acid  1 mg Oral Daily  . furosemide  40 mg Intravenous BID  . heparin  5,000 Units Subcutaneous 3 times per day  . levothyroxine  50 mcg Oral QAC breakfast  . lidocaine (PF)      . multivitamin with minerals  1 tablet Oral Daily  . sodium chloride flush  3 mL Intravenous Q12H  . ursodiol  300 mg Oral TID  . vitamin E  400 Units Oral Daily  . zinc sulfate  220 mg Oral Daily   Continuous Infusions:  PRN Meds:.sodium chloride, acetaminophen, calcium carbonate, hydroxypropyl methylcellulose / hypromellose, ondansetron (ZOFRAN) IV, sodium chloride flush  Antibiotics    Anti-infectives    Start     Dose/Rate Route Frequency Ordered Stop   05/11/15 1900  cefTRIAXone (ROCEPHIN) 1 g in dextrose 5 % 50 mL IVPB     1 g 100 mL/hr  over 30 Minutes Intravenous Every 24 hours 05/10/15 2151 05/18/15 1859   05/11/15 1900  azithromycin (ZITHROMAX) 500 mg in dextrose 5 % 250 mL IVPB     500 mg 250 mL/hr over 60 Minutes Intravenous Every 24 hours 05/10/15 2151 05/18/15 1859   05/10/15 1945  cefTRIAXone (ROCEPHIN) 1 g in dextrose 5 % 50 mL IVPB     1 g 100 mL/hr over 30 Minutes Intravenous  Once 05/10/15 1936 05/10/15 2123   05/10/15 1945  azithromycin (ZITHROMAX) 500 mg in dextrose 5 % 250 mL IVPB     500 mg 250 mL/hr over 60 Minutes Intravenous  Once 05/10/15 1936 05/10/15 2129      Subjective:   Maylana Bertolucci seen and examined today.  Patient states she is feeling mildly better since coming to the hospital.  Currently denies any chest pain. Does have shortness of breath with movement. Has had lower extremity swelling as well as abdominal swelling for over a month. Denies abdominal pain, nausea or vomiting.  Objective:   Filed Vitals:   05/11/15 0806 05/11/15 0951 05/11/15 0958 05/11/15 1212  BP: 142/73 127/51 111/51 105/45  Pulse: 76   69  Temp: 98.8 F (37.1 C)   98.5 F (36.9 C)  TempSrc: Oral   Oral  Resp: 20   20  Height:      Weight:      SpO2: 94%   99%    Wt Readings from Last 3 Encounters:  05/11/15 69.264 kg (152 lb 11.2 oz)  04/13/15 59.875 kg (132 lb)  03/24/15 59.024 kg (130 lb 2 oz)     Intake/Output Summary (Last 24 hours) at 05/11/15 1455 Last data filed at 05/11/15 0847  Gross per 24 hour  Intake    240 ml  Output    500 ml  Net   -260 ml    Exam  General: Well developed, well nourished, NAD, appears stated age  HEENT: NCAT,  mucous membranes moist.   Cardiovascular: S1 S2 auscultated, no rubs, murmurs or gallops. Regular rate and rhythm.  Respiratory: Diminished breath sounds  Abdomen: Soft, nontender, distended, + bowel sounds  Extremities: warm dry without cyanosis clubbing. 3+ LE edema extending to the hips B/L   Neuro: AAOx3, nonfocal  Psych: Normal affect and demeanor   Data Review   Micro Results Recent Results (from the past 240 hour(s))  Culture, blood (routine x 2)     Status: None (Preliminary result)   Collection Time: 05/10/15  7:45 PM  Result Value Ref Range Status   Specimen Description BLOOD LEFT FOREARM  Final   Special Requests BOTTLES DRAWN AEROBIC AND ANAEROBIC 5CC  Final   Culture NO GROWTH < 24 HOURS  Final   Report Status PENDING  Incomplete  Culture, blood (routine x 2)     Status: None (Preliminary result)   Collection Time: 05/10/15  8:00 PM  Result Value Ref Range Status   Specimen Description BLOOD RIGHT ANTECUBITAL  Final   Special Requests BOTTLES DRAWN AEROBIC AND ANAEROBIC 10CC  Final   Culture NO GROWTH <  24 HOURS  Final   Report Status PENDING  Incomplete  Culture, body fluid-bottle     Status: None (Preliminary result)   Collection Time: 05/11/15  9:59 AM  Result Value Ref Range Status   Specimen Description FLUID RIGHT PLEURAL  Final   Special Requests BOTTLES DRAWN AEROBIC AND ANAEROBIC 10MLS  Final   Culture PENDING  Incomplete   Report Status PENDING  Incomplete  Gram stain     Status: None   Collection Time: 05/11/15  9:59 AM  Result Value Ref Range Status   Specimen Description FLUID RIGHT PLEURAL  Final   Special Requests NONE  Final   Gram Stain   Final    MODERATE WBC PRESENT,BOTH PMN AND MONONUCLEAR NO ORGANISMS SEEN    Report Status 05/11/2015 FINAL  Final    Radiology Reports Dg Chest 1 View  05/11/2015  CLINICAL DATA:  Followup right pleural effusion. Status post thoracentesis. Primary biliary cirrhosis. Chronic kidney disease stage 3. EXAM: CHEST 1 VIEW COMPARISON:  05/10/2015 FINDINGS: Small to moderate right pleural effusion is decreased in size. No definite pneumothorax visualized. Tiny left pleural effusion is not significant changed. Right lower lobe atelectasis or infiltrate shows improvement. Heart size is stable. IMPRESSION: Decreased size of right pleural effusion status post thoracentesis. No definite pneumothorax visualized. Electronically Signed   By: Earle Gell M.D.   On: 05/11/2015 10:33   Dg Chest 2 View  05/10/2015  CLINICAL DATA:  Shortness of breath, cough and right-sided pain for 2 weeks. EXAM: CHEST  2 VIEW COMPARISON:  None. FINDINGS: Trachea is midline. Heart size is accentuated by low lung volumes. There is right basilar collapse/ consolidation with a moderate right pleural effusion. Left lung is grossly clear. IMPRESSION: Moderate right pleural effusion with collapse/ consolidation at the right lung base. Followup PA and lateral chest X-ray is recommended in 3-4 weeks following trial of antibiotic therapy to ensure resolution and exclude underlying  malignancy. Electronically Signed   By: Lorin Picket M.D.   On: 05/10/2015 16:48   US Thoracentesis Asp Pleural Space W/img Guide  05/11/2015  INDICATION: Symptomatic right sided pleural effusion. EXAM: US THORACENTESIS ASP PLEURAL SPACE W/IMG GUIDE COMPARISON:  None. MEDICATIONS: 10 cc 1% lidocaine COMPLICATIONS: None immediate. TECHNIQUE: Informed written consent was obtained from the patient after a discussion of the risks, benefits and alternatives to treatment. A timeout was performed prior to the initiation of the procedure. Initial ultrasound scanning demonstrates a right pleural effusion. The lower chest was prepped and draped in the usual sterile fashion. 1% lidocaine was used for local anesthesia. Under direct ultrasound guidance, a 19 gauge, 7-cm, Yueh catheter was introduced. An ultrasound image was saved for documentation purposes. The thoracentesis was performed. The catheter was removed and a dressing was applied. The patient tolerated the procedure well without immediate post procedural complication. The patient was escorted to have an upright chest radiograph. FINDINGS: A total of approximately 350 cc of cloudy fluid was removed. Requested samples were sent to the laboratory. IMPRESSION: Successful ultrasound-guided Rt sided thoracentesis yielding 350 cc of pleural fluid. Read by:  Lavonia Drafts [facility] Electronically Signed   By: Markus Daft M.D.   On: 05/11/2015 10:18    CBC  Recent Labs Lab 05/10/15 1622 05/11/15 0231  WBC 4.9 4.5  HGB 10.5* 10.3*  HCT 32.3* 29.9*  PLT 121* 104*  MCV 96.1 95.2  MCH 31.3 32.8  MCHC 32.5 34.4  RDW 15.0 15.0  LYMPHSABS  --  1.3  MONOABS  --  0.8  EOSABS  --  0.2  BASOSABS  --  0.0    Chemistries   Recent Labs Lab 05/10/15 1622 05/10/15 2015 05/10/15 2343 05/11/15 0231  NA 141  --   --  141  K 4.1  --   --  3.8  CL 108  --   --  107  CO2 21*  --   --  25  GLUCOSE 100*  --   --  120*  BUN 35*  --   --  35*  CREATININE 1.15*  --    --  1.24*  CALCIUM 8.9  --   --  8.6*  MG  --   --  1.9  --   AST  --  35  --  31  ALT  --  21  --  20  ALKPHOS  --  91  --  103  BILITOT  --  0.9  --  0.7   ------------------------------------------------------------------------------------------------------------------ estimated creatinine clearance is 28.6 mL/min (by C-G formula based on Cr of 1.24). ------------------------------------------------------------------------------------------------------------------ No results for input(s): HGBA1C in the last 72 hours. ------------------------------------------------------------------------------------------------------------------ No results for input(s): CHOL, HDL, LDLCALC, TRIG, CHOLHDL, LDLDIRECT in the last 72 hours. ------------------------------------------------------------------------------------------------------------------  Recent Labs  05/10/15 2343  TSH 7.687*   ------------------------------------------------------------------------------------------------------------------ No results for input(s): VITAMINB12, FOLATE, FERRITIN, TIBC, IRON, RETICCTPCT in the last 72 hours.  Coagulation profile  Recent Labs Lab 05/10/15 2343  INR 1.30    No results for input(s): DDIMER in the last 72 hours.  Cardiac Enzymes No results for input(s): CKMB, TROPONINI, MYOGLOBIN in the last 168 hours.  Invalid input(s): CK ------------------------------------------------------------------------------------------------------------------ Invalid input(s): POCBNP    Laura Caldas D.O. on 05/11/2015 at 2:55 PM  Between 7am to 7pm - Pager - [phone]  After 7pm go to [email or web address] - password [email or web address]  And look for the night coverage person covering for me after hours  [facility] Office  [phone]

## 2015-05-11 NOTE — Progress Notes (Signed)
  Echocardiogram 2D Echocardiogram has been performed.  Jessica French 05/11/2015, 12:03 PM

## 2015-05-12 ENCOUNTER — Inpatient Hospital Stay (HOSPITAL_COMMUNITY): Payer: Medicare Other

## 2015-05-12 DIAGNOSIS — R601 Generalized edema: Secondary | ICD-10-CM

## 2015-05-12 DIAGNOSIS — J189 Pneumonia, unspecified organism: Principal | ICD-10-CM

## 2015-05-12 LAB — BASIC METABOLIC PANEL
ANION GAP: 8 (ref 5–15)
BUN: 34 mg/dL — ABNORMAL HIGH (ref 6–20)
CO2: 26 mmol/L (ref 22–32)
Calcium: 8.4 mg/dL — ABNORMAL LOW (ref 8.9–10.3)
Chloride: 106 mmol/L (ref 101–111)
Creatinine, Ser: 1.19 mg/dL — ABNORMAL HIGH (ref 0.44–1.00)
GFR calc non Af Amer: 40 mL/min — ABNORMAL LOW (ref >60)
GFR, EST AFRICAN AMERICAN: 47 mL/min — AB (ref >60)
GLUCOSE: 79 mg/dL (ref 65–99)
POTASSIUM: 3.6 mmol/L (ref 3.5–5.1)
Sodium: 140 mmol/L (ref 135–145)

## 2015-05-12 LAB — CBC
HEMATOCRIT: 27.4 % — AB (ref 36.0–46.0)
HEMOGLOBIN: 8.9 g/dL — AB (ref 12.0–15.0)
MCH: 31 pg (ref 26.0–34.0)
MCHC: 32.5 g/dL (ref 30.0–36.0)
MCV: 95.5 fL (ref 78.0–100.0)
Platelets: 102 10*3/uL — ABNORMAL LOW (ref 150–400)
RBC: 2.87 MIL/uL — AB (ref 3.87–5.11)
RDW: 14.7 % (ref 11.5–15.5)
WBC: 3.3 10*3/uL — AB (ref 4.0–10.5)

## 2015-05-12 LAB — TROPONIN I
Troponin I: <0.03 ng/mL
Troponin I: <0.03 ng/mL (ref <0.031)

## 2015-05-12 LAB — PROCALCITONIN: Procalcitonin: <0.1 ng/mL

## 2015-05-12 LAB — LACTATE DEHYDROGENASE: LDH: 126 U/L (ref 98–192)

## 2015-05-12 MED ORDER — POTASSIUM CHLORIDE CRYS ER 20 MEQ PO TBCR
40.0000 meq | EXTENDED_RELEASE_TABLET | Freq: Once | ORAL | Status: AC
  Administered 2015-05-12: 40 meq via ORAL
  Filled 2015-05-12: qty 2

## 2015-05-12 MED ORDER — LIDOCAINE 5 % EX PTCH
1.0000 | MEDICATED_PATCH | CUTANEOUS | Status: DC
  Administered 2015-05-12 – 2015-05-15 (×4): 1 via TRANSDERMAL
  Filled 2015-05-12 (×4): qty 1

## 2015-05-12 NOTE — Evaluation (Signed)
Physical Therapy Evaluation Patient Details Name: Jessica French MRN: [id number] DOB: [birth year] Today's Date: 05/12/2015   History of Present Illness  Pt is a 82 yo female with h/o biliary cirrhosis and LBP who wwas admitted with 1 mo history of abdominal and LE swelling that is worsening.  pt found to have pneumonia and had thoracentesis on 2/28.  Pt to have abdominal ultrasound this afternoon 3/1.  Pt also with anasarca.  Work up in progress.  Clinical Impression  Pt admitted with above diagnosis. Pt currently with functional limitations due to the deficits listed below (see PT Problem List). Pt reports chronic back pain that she has had for years but that she has had new pain recently that began after a fall back into tub when trying to get out. May be helpful to obtain x-rays of spine to r/o compression fracture. Pt ambulated 45' with min-guard A and RW, O2 sats remained in mid 90's on RA.  Pt will benefit from skilled PT to increase their independence and safety with mobility to allow discharge to the venue listed below.       Follow Up Recommendations Home health PT;Supervision - Intermittent    Equipment Recommendations  Other (comment) (rollator)    Recommendations for Other Services       Precautions / Restrictions Precautions Precautions: Fall Precaution Comments: pt reports that she fell in the bathtub when trying to get out the day before her last colonoscopy, since that time she has had stabbing pain in her mid to low back R>L that is different than her chronic back pain Restrictions Weight Bearing Restrictions: No      Mobility  Bed Mobility Overal bed mobility: Needs Assistance Bed Mobility: Supine to Sit     Supine to sit: Min assist     General bed mobility comments: pt received in chair  Transfers Overall transfer level: Needs assistance Equipment used: Rolling walker (2 wheeled) Transfers: Sit to/from Stand Sit to Stand: Min guard         General  transfer comment: pt has been interested in trying an assistive device so eduacted on use of RW, vc's for hand placement, movement slow due to back pain  Ambulation/Gait Ambulation/Gait assistance: Min guard Ambulation Distance (Feet): 80 Feet Assistive device: Rolling walker (2 wheeled) Gait Pattern/deviations: Step-through pattern;Decreased stride length;Trunk flexed Gait velocity: decreased Gait velocity interpretation: <1.8 ft/sec, indicative of risk for recurrent falls General Gait Details: pt reported that she felt safer with RW than with no AD, helped back pain as well, would recommend rollator for home to be able to sit when fatigued  Stairs            Wheelchair Mobility    Modified Rankin (Stroke Patients Only)       Balance Overall balance assessment: Needs assistance Sitting-balance support: No upper extremity supported Sitting balance-Leahy Scale: Good     Standing balance support: No upper extremity supported Standing balance-Leahy Scale: Fair Standing balance comment: Pt could stand without support but could not take challenges.                              Pertinent Vitals/Pain Pain Assessment: Faces Pain Score: 6  Faces Pain Scale: Hurts even more Pain Location: mid to low back, R>L Pain Descriptors / Indicators: Stabbing Pain Intervention(s): Monitored during session;Repositioned;Heat applied        O2 sats 94% on RA  Home Living Family/patient expects to  be discharged to:: Private residence Living Arrangements: Alone Available Help at Discharge: Friend(s);Family;Available PRN/intermittently Type of Home: House Home Access: Stairs to enter Entrance Stairs-Rails: Right;Left;Can reach both Entrance Stairs-Number of Steps: 3 Home Layout: One level Home Equipment: Grab bars - tub/shower Additional Comments: has wanted a cane for some time now.      Prior Function Level of Independence: Independent         Comments: Lives next  door to her daughter who does work full time.     Hand Dominance   Dominant Hand: Left    Extremity/Trunk Assessment   Upper Extremity Assessment: Defer to OT evaluation           Lower Extremity Assessment: Generalized weakness      Cervical / Trunk Assessment: Kyphotic  Communication   Communication: HOH  Cognition Arousal/Alertness: Awake/alert Behavior During Therapy: WFL for tasks assessed/performed Overall Cognitive Status: Within Functional Limits for tasks assessed       Memory: Decreased short-term memory (assume this is baseline)              General Comments General comments (skin integrity, edema, etc.): bilateal LE swelling and she reports tingling as well. Pt agreeable to rollator.     Exercises General Exercises - Lower Extremity Ankle Circles/Pumps: AROM;Both;10 reps;Seated Hip Flexion/Marching: AROM;5 reps;Standing Mini-Sqauts: AROM;5 reps;Standing      Assessment/Plan    PT Assessment Patient needs continued PT services  PT Diagnosis Difficulty walking;Abnormality of gait;Generalized weakness;Acute pain   PT Problem List Decreased strength;Decreased activity tolerance;Decreased balance;Decreased mobility;Decreased knowledge of use of DME;Decreased knowledge of precautions;Pain;Cardiopulmonary status limiting activity  PT Treatment Interventions DME instruction;Gait training;Stair training;Functional mobility training;Therapeutic activities;Therapeutic exercise;Balance training;Patient/family education   PT Goals (Current goals can be found in the Care Plan section) Acute Rehab PT Goals Patient Stated Goal: to get well and be at home PT Goal Formulation: With patient Time For Goal Achievement: 05/26/15 Potential to Achieve Goals: Good    Frequency Min 3X/week   Barriers to discharge Decreased caregiver support home alone    Co-evaluation               End of Session Equipment Utilized During Treatment: Gait belt Activity  Tolerance: Patient tolerated treatment well Patient left: in chair;with call bell/phone within reach Nurse Communication: Mobility status;Other (comment) (back pain)         Time: 1417-1440 PT Time Calculation (min) (ACUTE ONLY): 23 min   Charges:   PT Evaluation $PT Eval Moderate Complexity: 1 Procedure PT Treatments $Gait Training: 8-22 mins   PT G Codes:      Leighton Roach, PT  Acute Rehab Services  [place], [place] 05/12/2015, 3:52 PM

## 2015-05-12 NOTE — Care Management Note (Addendum)
Case Management Note  Patient Details  Name: Jessica French MRN: [id number] Date of Birth: 03/02/1927  Subjective/Objective:    82 y.o. F admitted 05/10/2015 for CAP. PT/OT recommending HHPT/HHOT with DME: 3 in 1 and Rollator. Gilford Rile with Seat). Pt has used [facility] services in the past and looks forward to short term services with [facility] at discharge.                Action/Plan: HHOT/HHPT arranged through [facility], [place]. Anticipate 1-2 day discharge as Sputum, Blood cultures are pending. CM will continue to follow for disposition needs.    Expected Discharge Date:                  Expected Discharge Plan:  [name]  In-House Referral:     Discharge planning Services  CM Consult  Post Acute Care Choice:  Durable Medical Equipment, Home Health Choice offered to:  Patient  DME Arranged:  Walker rolling with seat, 3-N-1 DME Agency:  [place]:  PT, OT [place] Agency:  [place]  Status of Service:  Completed, signed off  Medicare Important Message Given:    Date Medicare IM Given:    Medicare IM give by:    Date Additional Medicare IM Given:    Additional Medicare Important Message give by:     If discussed at [place] of Stay Meetings, dates discussed:    Additional Comments:  Delrae Sawyers, RN 05/12/2015, 2:42 PM

## 2015-05-12 NOTE — Progress Notes (Signed)
[facility]                                                                              Patient Demographics  Jessica French, is a 82 y.o. female, DOB - 20-Oct-1926, [id number]  Admit date - 05/10/2015   Admitting Physician Vianne Bulls, MD  Outpatient Primary MD for the patient is Celedonio Savage, MD  LOS - 2   Chief Complaint  Patient presents with  . Pneumonia      Interim history  82 year old female with history of primary biliary cirrhosis, hypothyroidism, presented to emergency department with a one-month progressive lower extremity and abdominal swelling along with cough and fatigue. Patient was recently placed on Lasix by her primary doctor however without resolution of her symptoms. Patient being treated for pneumonia as well as anasarca. Echocardiogram pending. IR consulted for thoracentesis.  Assessment & Plan   Community acquired pneumonia -Failed outpatient treatment  -Chest x-ray showed moderate right pleural effusion with collapse/consolidation at the right lung base, recommended repeat x-rays in 3-4 weeks -Pending blood and sputum cultures -strep pneumonia and legionella urine antigens negative -Continue Rocephin and azithromycin  Right pleural effusion -Interventional radiology consulted and appreciated -Status post ultrasound-guided right thoracentesis, yielding 350 mL of cloudy fluid -LDH ration 0.5, protein 0.6-- cut off for exudates. Will follow culture.  Continue with IV antibiotics.   Acute Diastolic Heart Failure exacerbation:  Anasarca/lower extremity edema -Despite being on oral Lasix, swelling has not improved -Continue IV Lasix -Monitor intake and output, daily weight -Echocardiogram consistent with diastolic dysfunction.  Weight 71---69 Check [place] to evaluates for ascites  and cirrhosis.   Chronic kidney disease, stage III -currently stable -Continue to monitor closely due to diuresis  Hypothyroidism -Continue Synthroid  Primary biliary  cirrhosis -Followed by Dr. Henrene Pastor -Continue ursodiol  Anemia/thombocytopenia -Continue monitor CBC -No signs of blood loss at this time  Code Status: Full  Family Communication: daughter at bedside.   Disposition Plan: Admitted  Time Spent in minutes   30 minutes  Procedures  US guided thoracentesis Echocardiogram  Consults   Interventional radiology  DVT Prophylaxis  SCDs  Lab Results  Component Value Date   PLT 102* 05/12/2015    Medications  Scheduled Meds: . azithromycin  500 mg Intravenous Q24H  . calcium carbonate  1,250 mg Oral Q breakfast  . cefTRIAXone (ROCEPHIN)  IV  1 g Intravenous Q24H  . ferrous sulfate  325 mg Oral Q breakfast  . fluticasone  1 spray Each Nare Daily  . folic acid  1 mg Oral Daily  . furosemide  40 mg Intravenous BID  . heparin  5,000 Units Subcutaneous 3 times per day  . levothyroxine  50 mcg Oral QAC breakfast  . lidocaine  1 patch Transdermal Q24H  . multivitamin with minerals  1 tablet Oral Daily  . sodium chloride flush  3 mL Intravenous Q12H  . ursodiol  300 mg Oral TID  . vitamin E  400 Units Oral Daily  . zinc sulfate  220 mg Oral Daily   Continuous Infusions:  PRN Meds:.sodium chloride, acetaminophen, calcium carbonate, hydroxypropyl methylcellulose / hypromellose, ondansetron (ZOFRAN) IV, sodium chloride flush  Antibiotics  Anti-infectives    Start     Dose/Rate Route Frequency Ordered Stop   05/11/15 1900  cefTRIAXone (ROCEPHIN) 1 g in dextrose 5 % 50 mL IVPB     1 g 100 mL/hr over 30 Minutes Intravenous Every 24 hours 05/10/15 2151 05/18/15 1859   05/11/15 1900  azithromycin (ZITHROMAX) 500 mg in dextrose 5 % 250 mL IVPB     500 mg 250 mL/hr over 60 Minutes Intravenous Every 24 hours 05/10/15 2151 05/18/15 1859   05/10/15 1945  cefTRIAXone (ROCEPHIN) 1 g in dextrose 5 % 50 mL IVPB     1 g 100 mL/hr over 30 Minutes Intravenous  Once 05/10/15 1936 05/10/15 2123   05/10/15 1945  azithromycin (ZITHROMAX) 500 mg in  dextrose 5 % 250 mL IVPB     500 mg 250 mL/hr over 60 Minutes Intravenous  Once 05/10/15 1936 05/10/15 2129      Subjective:   Jessica French seen and examined today.  Still with dyspnea on exertion. Report abdominal distension since 3 weeks ago.  Complaining of back pain.  Left side chest pain   Objective:   Filed Vitals:   05/11/15 1540 05/11/15 1942 05/11/15 2321 05/12/15 0623  BP: 133/57 125/50 127/49 133/62  Pulse: 70 66 73 74  Temp: 98.3 F (36.8 C) 98 F (36.7 C) 98.4 F (36.9 C) 98.3 F (36.8 C)  TempSrc: Oral Oral Oral Oral  Resp: 19 18 18 22   Height:      Weight:    69.037 kg (152 lb 3.2 oz)  SpO2: 98% 100% 96% 94%    Wt Readings from Last 3 Encounters:  05/12/15 69.037 kg (152 lb 3.2 oz)  04/13/15 59.875 kg (132 lb)  03/24/15 59.024 kg (130 lb 2 oz)     Intake/Output Summary (Last 24 hours) at 05/12/15 1118 Last data filed at 05/12/15 [id number]  Gross per 24 hour  Intake    480 ml  Output    875 ml  Net   -395 ml    Exam  General: Well developed, well nourished, NAD, appears stated age  HEENT: NCAT,  mucous membranes moist.   Cardiovascular: S1 S2 auscultated, no rubs, murmurs or gallops. Regular rate and rhythm.  Respiratory: Diminished breath sounds  Abdomen: Soft, nontender, distended, + bowel sounds  Extremities: warm dry without cyanosis clubbing. 3+ LE edema extending to the hips B/L   Neuro: AAOx3, nonfocal   Data Review   Micro Results Recent Results (from the past 240 hour(s))  Culture, blood (routine x 2)     Status: None (Preliminary result)   Collection Time: 05/10/15  7:45 PM  Result Value Ref Range Status   Specimen Description BLOOD LEFT FOREARM  Final   Special Requests BOTTLES DRAWN AEROBIC AND ANAEROBIC 5CC  Final   Culture NO GROWTH < 24 HOURS  Final   Report Status PENDING  Incomplete  Culture, blood (routine x 2)     Status: None (Preliminary result)   Collection Time: 05/10/15  8:00 PM  Result Value Ref Range Status    Specimen Description BLOOD RIGHT ANTECUBITAL  Final   Special Requests BOTTLES DRAWN AEROBIC AND ANAEROBIC 10CC  Final   Culture NO GROWTH < 24 HOURS  Final   Report Status PENDING  Incomplete  Culture, body fluid-bottle     Status: None (Preliminary result)   Collection Time: 05/11/15  9:59 AM  Result Value Ref Range Status   Specimen Description FLUID RIGHT PLEURAL  Final  Special Requests BOTTLES DRAWN AEROBIC AND ANAEROBIC 10MLS  Final   Culture PENDING  Incomplete   Report Status PENDING  Incomplete  Gram stain     Status: None   Collection Time: 05/11/15  9:59 AM  Result Value Ref Range Status   Specimen Description FLUID RIGHT PLEURAL  Final   Special Requests NONE  Final   Gram Stain   Final    MODERATE WBC PRESENT,BOTH PMN AND MONONUCLEAR NO ORGANISMS SEEN    Report Status 05/11/2015 FINAL  Final    Radiology Reports Dg Chest 1 View  05/11/2015  CLINICAL DATA:  Followup right pleural effusion. Status post thoracentesis. Primary biliary cirrhosis. Chronic kidney disease stage 3. EXAM: CHEST 1 VIEW COMPARISON:  05/10/2015 FINDINGS: Small to moderate right pleural effusion is decreased in size. No definite pneumothorax visualized. Tiny left pleural effusion is not significant changed. Right lower lobe atelectasis or infiltrate shows improvement. Heart size is stable. IMPRESSION: Decreased size of right pleural effusion status post thoracentesis. No definite pneumothorax visualized. Electronically Signed   By: Earle Gell M.D.   On: 05/11/2015 10:33   Dg Chest 2 View  05/10/2015  CLINICAL DATA:  Shortness of breath, cough and right-sided pain for 2 weeks. EXAM: CHEST  2 VIEW COMPARISON:  None. FINDINGS: Trachea is midline. Heart size is accentuated by low lung volumes. There is right basilar collapse/ consolidation with a moderate right pleural effusion. Left lung is grossly clear. IMPRESSION: Moderate right pleural effusion with collapse/ consolidation at the right lung base.  Followup PA and lateral chest X-ray is recommended in 3-4 weeks following trial of antibiotic therapy to ensure resolution and exclude underlying malignancy. Electronically Signed   By: Lorin Picket M.D.   On: 05/10/2015 16:48   US Thoracentesis Asp Pleural Space W/img Guide  05/11/2015  INDICATION: Symptomatic right sided pleural effusion. EXAM: US THORACENTESIS ASP PLEURAL SPACE W/IMG GUIDE COMPARISON:  None. MEDICATIONS: 10 cc 1% lidocaine COMPLICATIONS: None immediate. TECHNIQUE: Informed written consent was obtained from the patient after a discussion of the risks, benefits and alternatives to treatment. A timeout was performed prior to the initiation of the procedure. Initial ultrasound scanning demonstrates a right pleural effusion. The lower chest was prepped and draped in the usual sterile fashion. 1% lidocaine was used for local anesthesia. Under direct ultrasound guidance, a 19 gauge, 7-cm, Yueh catheter was introduced. An ultrasound image was saved for documentation purposes. The thoracentesis was performed. The catheter was removed and a dressing was applied. The patient tolerated the procedure well without immediate post procedural complication. The patient was escorted to have an upright chest radiograph. FINDINGS: A total of approximately 350 cc of cloudy fluid was removed. Requested samples were sent to the laboratory. IMPRESSION: Successful ultrasound-guided Rt sided thoracentesis yielding 350 cc of pleural fluid. Read by:  Lavonia Drafts [facility] Electronically Signed   By: Markus Daft M.D.   On: 05/11/2015 10:18    CBC  Recent Labs Lab 05/10/15 1622 05/11/15 0231 05/12/15 0532  WBC 4.9 4.5 3.3*  HGB 10.5* 10.3* 8.9*  HCT 32.3* 29.9* 27.4*  PLT 121* 104* 102*  MCV 96.1 95.2 95.5  MCH 31.3 32.8 31.0  MCHC 32.5 34.4 32.5  RDW 15.0 15.0 14.7  LYMPHSABS  --  1.3  --   MONOABS  --  0.8  --   EOSABS  --  0.2  --   BASOSABS  --  0.0  --     Chemistries   Recent Labs Lab  05/10/15 1622 05/10/15 2015 05/10/15 2343 05/11/15 0231 05/12/15 0532  NA 141  --   --  141 140  K 4.1  --   --  3.8 3.6  CL 108  --   --  107 106  CO2 21*  --   --  25 26  GLUCOSE 100*  --   --  120* 79  BUN 35*  --   --  35* 34*  CREATININE 1.15*  --   --  1.24* 1.19*  CALCIUM 8.9  --   --  8.6* 8.4*  MG  --   --  1.9  --   --   AST  --  35  --  31  --   ALT  --  21  --  20  --   ALKPHOS  --  91  --  103  --   BILITOT  --  0.9  --  0.7  --    ------------------------------------------------------------------------------------------------------------------ estimated creatinine clearance is 29.8 mL/min (by C-G formula based on Cr of 1.19). ------------------------------------------------------------------------------------------------------------------ No results for input(s): HGBA1C in the last 72 hours. ------------------------------------------------------------------------------------------------------------------ No results for input(s): CHOL, HDL, LDLCALC, TRIG, CHOLHDL, LDLDIRECT in the last 72 hours. ------------------------------------------------------------------------------------------------------------------  Recent Labs  05/10/15 2343  TSH 7.687*   ------------------------------------------------------------------------------------------------------------------ No results for input(s): VITAMINB12, FOLATE, FERRITIN, TIBC, IRON, RETICCTPCT in the last 72 hours.  Coagulation profile  Recent Labs Lab 05/10/15 2343  INR 1.30    No results for input(s): DDIMER in the last 72 hours.  Cardiac Enzymes No results for input(s): CKMB, TROPONINI, MYOGLOBIN in the last 168 hours.  Invalid input(s): CK ------------------------------------------------------------------------------------------------------------------ Invalid input(s): POCBNP    Milanna Kozlov A D.O. on 05/12/2015 at 11:18 AM  Between 7am to 7pm - Pager - [phone]  After 7pm go to  [email or web address] - password [email or web address]  And look for the night coverage person covering for me after hours  [facility] Office  [phone]

## 2015-05-12 NOTE — Evaluation (Signed)
Occupational Therapy Evaluation Patient Details Name: Jessica French MRN: [id number] DOB: [birth year] Today's Date: 05/12/2015    History of Present Illness Pt is a 82 yo female with h/o biliary cirrhosis and LBP who wwas admitted with 1 mo history of abdominal and LE swelling that is worsening.  pt found to have pneumonia and had thoracentesis on 2/28.  Pt to have abdominal ultrasound this afternoon 3/1.  Pt also with anasarca.  Work up in progress.   Clinical Impression   Pt admitted with the above diagnosis and has the deficits listed below. Pt would benefit from cont OT to attempt to increase independence with basic adls to mod I level so she can d/c home alone again with her daughter checking in on her from time to time.  Daughter lives next door but works full time.  Pt will need 3:1 and adaptive equipment pack to be independent.  Spoke with pt about others that might be able to check in on her as well and also talked about making sure she has cell phone on her at all times vs. Life line.  Need to see how pt progresses to see if she will need 24/7 S for a couple of days after returning home.      Follow Up Recommendations  Home health OT;Supervision/Assistance - 24 hour    Equipment Recommendations  3 in 1 bedside comode;Other (comment) (adaptive equipment pack (reacher/sock aid etc))    Recommendations for Other Services PT consult     Precautions / Restrictions Precautions Precautions: Fall Restrictions Weight Bearing Restrictions: No      Mobility Bed Mobility Overal bed mobility: Needs Assistance Bed Mobility: Supine to Sit     Supine to sit: Min assist     General bed mobility comments: min assist getting trunck up into full sitting.  Transfers Overall transfer level: Needs assistance Equipment used: 1 person hand held assist Transfers: Sit to/from Stand Sit to Stand: Min guard         General transfer comment: Pt transferred sit to stand well but needs more  assist once she gets walking.  Back pain limited pt today.    Balance Overall balance assessment: Needs assistance Sitting-balance support: Feet supported;No upper extremity supported Sitting balance-Leahy Scale: Good     Standing balance support: During functional activity Standing balance-Leahy Scale: Fair Standing balance comment: Pt could stand without support but could not take challenges.                             ADL Overall ADL's : Needs assistance/impaired Eating/Feeding: Independent;Sitting   Grooming: Wash/dry hands;Wash/dry face;Standing;Supervision/safety Grooming Details (indicate cue type and reason): pt stood at sink for appx 4 min.  Pt with a lot of back pain sitting and standing. Upper Body Bathing: Set up;Sitting   Lower Body Bathing: Moderate assistance;Sit to/from stand Lower Body Bathing Details (indicate cue type and reason): Pt having a lot of trouble reaching feet at this time due to swelling.  Upper Body Dressing : Set up;Sitting   Lower Body Dressing: Moderate assistance;Sit to/from stand Lower Body Dressing Details (indicate cue type and reason): Pt would benefit from AE to reach feet during adls. Pt cannot donn socks and shoes due to swelling and has trouble at home getting pants over feet due to changes in swelling. Toilet Transfer: Min guard;Ambulation;Comfort height toilet;Grab bars Toilet Transfer Details (indicate cue type and reason): Pt ambulated to bathroom with min guard.  Pt furniture cruises in room and not always safe with what she chooses to hold to. Toileting- Water quality scientist and Hygiene: Min guard;Sit to/from stand Toileting - Clothing Manipulation Details (indicate cue type and reason): min guard when standing due to poor balance.     Functional mobility during ADLs: Minimal assistance General ADL Comments: Pt does well with UE adls but needs assist with LE adls due to swelling. Pt does live alone and would benefit from  adaptive equipment.  Pt states she loves to get into the bottom of the tub but only does that if someone is home with her.  Otherwise, she sponge bathtes.      Vision Vision Assessment?: No apparent visual deficits   Perception     Praxis      Pertinent Vitals/Pain Pain Assessment: 0-10 Pain Score: 6  Pain Location: back pain Pain Descriptors / Indicators: Aching Pain Intervention(s): Limited activity within patient's tolerance;Monitored during session;Repositioned;Patient requesting pain meds-RN notified     Hand Dominance Left   Extremity/Trunk Assessment Upper Extremity Assessment Upper Extremity Assessment: Generalized weakness   Lower Extremity Assessment Lower Extremity Assessment: Defer to PT evaluation   Cervical / Trunk Assessment Cervical / Trunk Assessment: Kyphotic   Communication Communication Communication: HOH   Cognition Arousal/Alertness: Awake/alert Behavior During Therapy: Anxious;WFL for tasks assessed/performed Overall Cognitive Status: Within Functional Limits for tasks assessed       Memory: Decreased short-term memory             General Comments       Exercises       Shoulder Instructions      Home Living Family/patient expects to be discharged to:: Private residence Living Arrangements: Alone Available Help at Discharge: Friend(s);Family;Available PRN/intermittently Type of Home: House Home Access: Stairs to enter [employer] of Steps: 3 Entrance Stairs-Rails: Right;Left;Can reach both Home Layout: One level     Bathroom Shower/Tub: Tub/shower unit;Curtain Shower/tub characteristics: Architectural technologist: Standard     Home Equipment: Grab bars - tub/shower   Additional Comments: has wanted a cane for some time now.        Prior Functioning/Environment Level of Independence: Independent        Comments: Lives next door to her daughter who does work full time.    OT Diagnosis: Generalized  weakness;Acute pain   OT Problem List: Decreased strength;Decreased activity tolerance;Impaired balance (sitting and/or standing);Decreased knowledge of use of DME or AE;Pain   OT Treatment/Interventions: Self-care/ADL training;Therapeutic activities;DME and/or AE instruction    OT Goals(Current goals can be found in the care plan section) Acute Rehab OT Goals Patient Stated Goal: to get well and be at home OT Goal Formulation: With patient Time For Goal Achievement: 05/26/15 Potential to Achieve Goals: Good ADL Goals Pt Will Perform Lower Body Dressing: with modified independence;sit to/from stand;with adaptive equipment Additional ADL Goal #1: Pt will walk to bathroom and toilet on 3:1 over commode with mod I. Additional ADL Goal #2: Pt will state 3 things she can do at home to conserve energy during her daily tasks with no cues.  OT Frequency: Min 2X/week   Barriers to D/C: Decreased caregiver support  pt lives alone.  Not sure if she has 24/7 S.  Daughter lives next door but works full time.       Co-evaluation              End of Session Nurse Communication: Mobility status  Activity Tolerance: Patient tolerated treatment well;Patient limited by pain Patient  left: in chair;with call bell/phone within reach   Time: 1255-1315 OT Time Calculation (min): 20 min Charges:  OT General Charges $OT Visit: 1 Procedure OT Evaluation $OT Eval Moderate Complexity: 1 Procedure G-Codes:    Glenford Peers 2015-05-20, 1:29 PM  [phone]

## 2015-05-13 ENCOUNTER — Inpatient Hospital Stay (HOSPITAL_COMMUNITY): Payer: Medicare Other

## 2015-05-13 LAB — PATHOLOGIST SMEAR REVIEW

## 2015-05-13 LAB — BASIC METABOLIC PANEL
Anion gap: 11 (ref 5–15)
BUN: 34 mg/dL — ABNORMAL HIGH (ref 6–20)
CALCIUM: 8.5 mg/dL — AB (ref 8.9–10.3)
CO2: 24 mmol/L (ref 22–32)
CREATININE: 1.23 mg/dL — AB (ref 0.44–1.00)
Chloride: 104 mmol/L (ref 101–111)
GFR, EST AFRICAN AMERICAN: 45 mL/min — AB (ref >60)
GFR, EST NON AFRICAN AMERICAN: 39 mL/min — AB (ref >60)
Glucose, Bld: 96 mg/dL (ref 65–99)
Potassium: 4 mmol/L (ref 3.5–5.1)
SODIUM: 139 mmol/L (ref 135–145)

## 2015-05-13 LAB — CBC
HCT: 27.5 % — ABNORMAL LOW (ref 36.0–46.0)
Hemoglobin: 9 g/dL — ABNORMAL LOW (ref 12.0–15.0)
MCH: 31.3 pg (ref 26.0–34.0)
MCHC: 32.7 g/dL (ref 30.0–36.0)
MCV: 95.5 fL (ref 78.0–100.0)
PLATELETS: 112 10*3/uL — AB (ref 150–400)
RBC: 2.88 MIL/uL — ABNORMAL LOW (ref 3.87–5.11)
RDW: 15 % (ref 11.5–15.5)
WBC: 4.5 10*3/uL (ref 4.0–10.5)

## 2015-05-13 LAB — T4, FREE: FREE T4: 1.01 ng/dL (ref 0.61–1.12)

## 2015-05-13 LAB — TROPONIN I

## 2015-05-13 MED ORDER — SPIRONOLACTONE 25 MG PO TABS
25.0000 mg | ORAL_TABLET | Freq: Every day | ORAL | Status: DC
  Administered 2015-05-13 – 2015-05-14 (×2): 25 mg via ORAL
  Filled 2015-05-13 (×2): qty 1

## 2015-05-13 MED ORDER — FUROSEMIDE 10 MG/ML IJ SOLN
40.0000 mg | Freq: Three times a day (TID) | INTRAMUSCULAR | Status: DC
  Administered 2015-05-13 – 2015-05-15 (×6): 40 mg via INTRAVENOUS
  Filled 2015-05-13 (×6): qty 4

## 2015-05-13 NOTE — Progress Notes (Signed)
[facility]                                                                              Patient Demographics  Jessica French, is a 82 y.o. female, DOB - 04-Apr-1926, [id number]  Admit date - 05/10/2015   Admitting Physician Vianne Bulls, MD  Outpatient Primary MD for the patient is Celedonio Savage, MD  LOS - 3   Chief Complaint  Patient presents with  . Pneumonia      Interim history  82 year old female with history of primary biliary cirrhosis, hypothyroidism, presented to emergency department with a one-month progressive lower extremity and abdominal swelling along with cough and fatigue. Patient was recently placed on Lasix by her primary doctor however without resolution of her symptoms. Patient being treated for pneumonia as well as anasarca. Echocardiogram pending. IR consulted for thoracentesis.  Assessment & Plan   Community acquired pneumonia -Failed outpatient treatment  -Chest x-ray showed moderate right pleural effusion with collapse/consolidation at the right lung base, recommended repeat x-rays in 3-4 weeks -Pending blood and sputum cultures -strep pneumonia and legionella urine antigens negative -Continue Rocephin and azithromycin  Right pleural effusion -Interventional radiology consulted and appreciated -Status post ultrasound-guided right thoracentesis, yielding 350 mL of cloudy fluid -LDH ration 0.5, protein 0.6-- cut off for exudates. Will follow culture. No growth to date.  Continue with IV antibiotics.   Acute Diastolic Heart Failure exacerbation:  Anasarca/lower extremity edema -Despite being on oral Lasix, swelling has not improved -Continue IV Lasix -Monitor intake and output, daily weight -Echocardiogram consistent with diastolic dysfunction.  Weight 71---69 [place] with finding of cirrhosis and ascites.  Start spironolactone.  Change lasix to TID.   Chronic kidney disease, stage III -currently stable -Continue to monitor closely due to  diuresis  Hypothyroidism -Continue Synthroid  Primary biliary cirrhosis -Followed by Dr. Henrene Pastor -Continue ursodiol Start spironolactone  Anemia/thombocytopenia -Continue monitor CBC -No signs of blood loss at this time  Code Status: Full  Family Communication: daughter at bedside.   Disposition Plan: Admitted  Time Spent in minutes   30 minutes  Procedures  US guided thoracentesis Echocardiogram  Consults   Interventional radiology  DVT Prophylaxis  SCDs  Lab Results  Component Value Date   PLT 112* 05/13/2015    Medications  Scheduled Meds: . azithromycin  500 mg Intravenous Q24H  . calcium carbonate  1,250 mg Oral Q breakfast  . cefTRIAXone (ROCEPHIN)  IV  1 g Intravenous Q24H  . ferrous sulfate  325 mg Oral Q breakfast  . fluticasone  1 spray Each Nare Daily  . folic acid  1 mg Oral Daily  . furosemide  40 mg Intravenous 3 times per day  . heparin  5,000 Units Subcutaneous 3 times per day  . levothyroxine  50 mcg Oral QAC breakfast  . lidocaine  1 patch Transdermal Q24H  . multivitamin with minerals  1 tablet Oral Daily  . sodium chloride flush  3 mL Intravenous Q12H  . spironolactone  25 mg Oral Daily  . ursodiol  300 mg Oral TID  . vitamin E  400 Units Oral Daily  . zinc sulfate  220 mg Oral Daily   Continuous  Infusions:  PRN Meds:.sodium chloride, acetaminophen, calcium carbonate, hydroxypropyl methylcellulose / hypromellose, ondansetron (ZOFRAN) IV, sodium chloride flush  Antibiotics    Anti-infectives    Start     Dose/Rate Route Frequency Ordered Stop   05/11/15 1900  cefTRIAXone (ROCEPHIN) 1 g in dextrose 5 % 50 mL IVPB     1 g 100 mL/hr over 30 Minutes Intravenous Every 24 hours 05/10/15 2151 05/18/15 1859   05/11/15 1900  azithromycin (ZITHROMAX) 500 mg in dextrose 5 % 250 mL IVPB     500 mg 250 mL/hr over 60 Minutes Intravenous Every 24 hours 05/10/15 2151 05/18/15 1859   05/10/15 1945  cefTRIAXone (ROCEPHIN) 1 g in dextrose 5 % 50 mL  IVPB     1 g 100 mL/hr over 30 Minutes Intravenous  Once 05/10/15 1936 05/10/15 2123   05/10/15 1945  azithromycin (ZITHROMAX) 500 mg in dextrose 5 % 250 mL IVPB     500 mg 250 mL/hr over 60 Minutes Intravenous  Once 05/10/15 1936 05/10/15 2129      Subjective:   Jessica French seen and examined today.  Feeling better , back pain better   Objective:   Filed Vitals:   05/12/15 1200 05/12/15 2147 05/13/15 0419 05/13/15 1259  BP: 119/47 127/50 138/61 128/46  Pulse: 69 74 84 73  Temp: 98.3 F (36.8 C) 98.5 F (36.9 C) 98.6 F (37 C) 98.7 F (37.1 C)  TempSrc: Oral Oral Oral Oral  Resp: 18 18 18 18   Height:      Weight:   69.582 kg (153 lb 6.4 oz)   SpO2: 96% 93% 93% 98%    Wt Readings from Last 3 Encounters:  05/13/15 69.582 kg (153 lb 6.4 oz)  04/13/15 59.875 kg (132 lb)  03/24/15 59.024 kg (130 lb 2 oz)     Intake/Output Summary (Last 24 hours) at 05/13/15 1527 Last data filed at 05/13/15 1300  Gross per 24 hour  Intake   1320 ml  Output    360 ml  Net    960 ml    Exam  General: Well developed, well nourished, NAD, appears stated age  HEENT: NCAT,  mucous membranes moist.   Cardiovascular: S1 S2 auscultated, no rubs, murmurs or gallops. Regular rate and rhythm.  Respiratory: Diminished breath sounds  Abdomen: Soft, nontender, distended, + bowel sounds  Extremities: warm dry without cyanosis clubbing. 3+ LE edema extending to the hips B/L   Neuro: AAOx3, nonfocal   Data Review   Micro Results Recent Results (from the past 240 hour(s))  Culture, blood (routine x 2)     Status: None (Preliminary result)   Collection Time: 05/10/15  7:45 PM  Result Value Ref Range Status   Specimen Description BLOOD LEFT FOREARM  Final   Special Requests BOTTLES DRAWN AEROBIC AND ANAEROBIC 5CC  Final   Culture NO GROWTH 3 DAYS  Final   Report Status PENDING  Incomplete  Culture, blood (routine x 2)     Status: None (Preliminary result)   Collection Time: 05/10/15  8:00  PM  Result Value Ref Range Status   Specimen Description BLOOD RIGHT ANTECUBITAL  Final   Special Requests BOTTLES DRAWN AEROBIC AND ANAEROBIC 10CC  Final   Culture NO GROWTH 3 DAYS  Final   Report Status PENDING  Incomplete  Culture, body fluid-bottle     Status: None (Preliminary result)   Collection Time: 05/11/15  9:59 AM  Result Value Ref Range Status   Specimen Description FLUID RIGHT  PLEURAL  Final   Special Requests BOTTLES DRAWN AEROBIC AND ANAEROBIC 10MLS  Final   Culture NO GROWTH 2 DAYS  Final   Report Status PENDING  Incomplete  Gram stain     Status: None   Collection Time: 05/11/15  9:59 AM  Result Value Ref Range Status   Specimen Description FLUID RIGHT PLEURAL  Final   Special Requests NONE  Final   Gram Stain   Final    MODERATE WBC PRESENT,BOTH PMN AND MONONUCLEAR NO ORGANISMS SEEN    Report Status 05/11/2015 FINAL  Final    Radiology Reports Dg Chest 1 View  05/11/2015  CLINICAL DATA:  Followup right pleural effusion. Status post thoracentesis. Primary biliary cirrhosis. Chronic kidney disease stage 3. EXAM: CHEST 1 VIEW COMPARISON:  05/10/2015 FINDINGS: Small to moderate right pleural effusion is decreased in size. No definite pneumothorax visualized. Tiny left pleural effusion is not significant changed. Right lower lobe atelectasis or infiltrate shows improvement. Heart size is stable. IMPRESSION: Decreased size of right pleural effusion status post thoracentesis. No definite pneumothorax visualized. Electronically Signed   By: Earle Gell M.D.   On: 05/11/2015 10:33   Dg Chest 2 View  05/10/2015  CLINICAL DATA:  Shortness of breath, cough and right-sided pain for 2 weeks. EXAM: CHEST  2 VIEW COMPARISON:  None. FINDINGS: Trachea is midline. Heart size is accentuated by low lung volumes. There is right basilar collapse/ consolidation with a moderate right pleural effusion. Left lung is grossly clear. IMPRESSION: Moderate right pleural effusion with collapse/  consolidation at the right lung base. Followup PA and lateral chest X-ray is recommended in 3-4 weeks following trial of antibiotic therapy to ensure resolution and exclude underlying malignancy. Electronically Signed   By: Lorin Picket M.D.   On: 05/10/2015 16:48   US Abdomen Complete  05/13/2015  CLINICAL DATA:  Abdominal distention. Evaluate for ascites and cirrhosis. EXAM: ABDOMEN ULTRASOUND COMPLETE COMPARISON:  May 11, 2015 FINDINGS: Gallbladder: The gallbladder wall is thickened. This is secondary to adjacent ascites. No gallstones are noted. No sonographic Murphy sign noted by sonographer. Common bile duct: Diameter: 4.5 mm Liver: There is nodular contour of the liver. The echotexture of the liver is heterogeneous. IVC: No abnormality visualized. Pancreas: Visualized portion unremarkable. Spleen: Size and appearance within normal limits. Right Kidney: Length: 10 cm. Echogenicity within normal limits. No mass or hydronephrosis visualized. Left Kidney: Length: 9.7 cm. Echogenicity within normal limits. No mass or hydronephrosis visualized. Abdominal aorta: No aneurysm visualized. Other findings: Ascites and bilateral pleural effusions are identified. IMPRESSION: Findings consistent with cirrhosis of liver. Ascites and bilateral pleural effusions noted. Electronically Signed   By: Abelardo Diesel M.D.   On: 05/13/2015 08:44   US Thoracentesis Asp Pleural Space W/img Guide  05/11/2015  INDICATION: Symptomatic right sided pleural effusion. EXAM: US THORACENTESIS ASP PLEURAL SPACE W/IMG GUIDE COMPARISON:  None. MEDICATIONS: 10 cc 1% lidocaine COMPLICATIONS: None immediate. TECHNIQUE: Informed written consent was obtained from the patient after a discussion of the risks, benefits and alternatives to treatment. A timeout was performed prior to the initiation of the procedure. Initial ultrasound scanning demonstrates a right pleural effusion. The lower chest was prepped and draped in the usual sterile  fashion. 1% lidocaine was used for local anesthesia. Under direct ultrasound guidance, a 19 gauge, 7-cm, Yueh catheter was introduced. An ultrasound image was saved for documentation purposes. The thoracentesis was performed. The catheter was removed and a dressing was applied. The patient tolerated the procedure well without  immediate post procedural complication. The patient was escorted to have an upright chest radiograph. FINDINGS: A total of approximately 350 cc of cloudy fluid was removed. Requested samples were sent to the laboratory. IMPRESSION: Successful ultrasound-guided Rt sided thoracentesis yielding 350 cc of pleural fluid. Read by:  Lavonia Drafts [facility] Electronically Signed   By: Markus Daft M.D.   On: 05/11/2015 10:18    CBC  Recent Labs Lab 05/10/15 1622 05/11/15 0231 05/12/15 0532 05/13/15 0451  WBC 4.9 4.5 3.3* 4.5  HGB 10.5* 10.3* 8.9* 9.0*  HCT 32.3* 29.9* 27.4* 27.5*  PLT 121* 104* 102* 112*  MCV 96.1 95.2 95.5 95.5  MCH 31.3 32.8 31.0 31.3  MCHC 32.5 34.4 32.5 32.7  RDW 15.0 15.0 14.7 15.0  LYMPHSABS  --  1.3  --   --   MONOABS  --  0.8  --   --   EOSABS  --  0.2  --   --   BASOSABS  --  0.0  --   --     Chemistries   Recent Labs Lab 05/10/15 1622 05/10/15 2015 05/10/15 2343 05/11/15 0231 05/12/15 0532 05/13/15 0451  NA 141  --   --  141 140 139  K 4.1  --   --  3.8 3.6 4.0  CL 108  --   --  107 106 104  CO2 21*  --   --  25 26 24   GLUCOSE 100*  --   --  120* 79 96  BUN 35*  --   --  35* 34* 34*  CREATININE 1.15*  --   --  1.24* 1.19* 1.23*  CALCIUM 8.9  --   --  8.6* 8.4* 8.5*  MG  --   --  1.9  --   --   --   AST  --  35  --  31  --   --   ALT  --  21  --  20  --   --   ALKPHOS  --  91  --  103  --   --   BILITOT  --  0.9  --  0.7  --   --    ------------------------------------------------------------------------------------------------------------------ estimated creatinine clearance is 29 mL/min (by C-G formula based on Cr of  1.23). ------------------------------------------------------------------------------------------------------------------ No results for input(s): HGBA1C in the last 72 hours. ------------------------------------------------------------------------------------------------------------------ No results for input(s): CHOL, HDL, LDLCALC, TRIG, CHOLHDL, LDLDIRECT in the last 72 hours. ------------------------------------------------------------------------------------------------------------------  Recent Labs  05/10/15 2343  TSH 7.687*   ------------------------------------------------------------------------------------------------------------------ No results for input(s): VITAMINB12, FOLATE, FERRITIN, TIBC, IRON, RETICCTPCT in the last 72 hours.  Coagulation profile  Recent Labs Lab 05/10/15 2343  INR 1.30    No results for input(s): DDIMER in the last 72 hours.  Cardiac Enzymes  Recent Labs Lab 05/12/15 1115 05/12/15 1646 05/13/15 0001  TROPONINI <0.03 <0.03 <0.03   ------------------------------------------------------------------------------------------------------------------ Invalid input(s): POCBNP    Briceson Broadwater A D.O. on 05/13/2015 at 3:27 PM  Between 7am to 7pm - Pager - [phone]  After 7pm go to [email or web address] - password [email or web address]  And look for the night coverage person covering for me after hours  [facility] Office  [phone]

## 2015-05-13 NOTE — Care Management Important Message (Signed)
Important Message  Patient Details  Name: Jessica French MRN: [id number] Date of Birth: March 01, 1927   Medicare Important Message Given:  Yes    [name] P [place] 05/13/2015, 2:10 PM

## 2015-05-13 NOTE — Progress Notes (Signed)
Physical Therapy Treatment Patient Details Name: Jessica French MRN: [id number] DOB: 07-04-1926 Today's Date: 05/13/2015    History of Present Illness Pt is a 82 yo female with h/o biliary cirrhosis and LBP who wwas admitted with 1 mo history of abdominal and LE swelling that is worsening.  pt found to have pneumonia and had thoracentesis on 2/28.  Pt to have abdominal ultrasound this afternoon 3/1.  Pt also with anasarca.  Work up in progress.    PT Comments    Pt progressing well towards goals. Attempted ambulating w/o AD. Pt displayed minimal sway which improved with use of RW, 350 ft w/o rest break. Completed stair training today w/o safety concerns. Will continue to follow pt acutely for balance and strengthening. Recommending D/C with HHPT for safe tranision home.   Follow Up Recommendations  Home health PT;Supervision - Intermittent     Equipment Recommendations  Other (comment) (rollator)    Recommendations for Other Services       Precautions / Restrictions Precautions Precautions: Fall Restrictions Weight Bearing Restrictions: No    Mobility  Bed Mobility Overal bed mobility: Modified Independent Bed Mobility: Supine to Sit     Supine to sit: Modified independent (Device/Increase time)     General bed mobility comments: no assist needed to get to EOB  Transfers Overall transfer level: Needs assistance Equipment used: 1 person hand held assist Transfers: Sit to/from Stand Sit to Stand: Min guard         General transfer comment: pt needing HHA for inital stability after rising, 1 attempt needed to fully stand EOB, minimal sway noted  Ambulation/Gait Ambulation/Gait assistance: Min guard;Supervision Ambulation Distance (Feet): 350 Feet Assistive device: Rolling walker (2 wheeled);None Gait Pattern/deviations: Step-through pattern;Decreased stride length Gait velocity: decreased   General Gait Details: pt stable with RW, cues for keeping it close taking  turns, close supervision for ambulation w/o AD, cues to not use furniture   Stairs Stairs: Yes Stairs assistance: Min guard Stair Management: Two rails;Alternating pattern;Step to pattern;Forwards Number of Stairs: 10 General stair comments: close supervision, alternating between step over step and step to descending stairs, educated on stair safety  Wheelchair Mobility    Modified Rankin (Stroke Patients Only)       Balance     Sitting balance-Leahy Scale: Normal       Standing balance-Leahy Scale: Fair                      Cognition Arousal/Alertness: Awake/alert Behavior During Therapy: WFL for tasks assessed/performed Overall Cognitive Status: Within Functional Limits for tasks assessed                      Exercises General Exercises - Lower Extremity Ankle Circles/Pumps: AROM;Both;10 reps;Seated    General Comments General comments (skin integrity, edema, etc.): B LE edema      Pertinent Vitals/Pain Pain Assessment: 0-10 Pain Score: 4  Pain Location: back pain Pain Descriptors / Indicators: Aching Pain Intervention(s): Limited activity within patient's tolerance;Monitored during session    Home Living                      Prior Function            PT Goals (current goals can now be found in the care plan section) Acute Rehab PT Goals Patient Stated Goal: to get well and be at home Progress towards PT goals: Progressing toward goals    Frequency  Min 3X/week    PT Plan Current plan remains appropriate    Co-evaluation             End of Session Equipment Utilized During Treatment: Gait belt Activity Tolerance: Patient tolerated treatment well Patient left: in chair;with call bell/phone within reach;with family/visitor present     Time: [id number] PT Time Calculation (min) (ACUTE ONLY): 17 min  Charges:  $Gait Training: 8-22 mins                    G Codes:      Ara Kussmaul 2015/05/18, 2:48 PM Ara Kussmaul, Student Physical Therapist Acute Rehab [phone]

## 2015-05-14 LAB — BASIC METABOLIC PANEL
Anion gap: 13 (ref 5–15)
BUN: 31 mg/dL — AB (ref 6–20)
CO2: 27 mmol/L (ref 22–32)
CREATININE: 1.37 mg/dL — AB (ref 0.44–1.00)
Calcium: 8.9 mg/dL (ref 8.9–10.3)
Chloride: 98 mmol/L — ABNORMAL LOW (ref 101–111)
GFR calc Af Amer: 39 mL/min — ABNORMAL LOW (ref >60)
GFR, EST NON AFRICAN AMERICAN: 34 mL/min — AB (ref >60)
GLUCOSE: 166 mg/dL — AB (ref 65–99)
POTASSIUM: 3.8 mmol/L (ref 3.5–5.1)
Sodium: 138 mmol/L (ref 135–145)

## 2015-05-14 LAB — T3, FREE: T3 FREE: 1.7 pg/mL — AB (ref 2.0–4.4)

## 2015-05-14 MED ORDER — SPIRONOLACTONE 25 MG PO TABS
50.0000 mg | ORAL_TABLET | Freq: Every day | ORAL | Status: DC
  Administered 2015-05-15: 50 mg via ORAL
  Filled 2015-05-14: qty 2

## 2015-05-14 MED ORDER — AZITHROMYCIN 500 MG PO TABS
500.0000 mg | ORAL_TABLET | Freq: Every day | ORAL | Status: DC
  Administered 2015-05-14 – 2015-05-15 (×2): 500 mg via ORAL
  Filled 2015-05-14 (×2): qty 1

## 2015-05-14 NOTE — Progress Notes (Signed)
Occupational Therapy Treatment Patient Details Name: Jessica French MRN: [id number] DOB: 1927-02-13 Today's Date: 05/14/2015    History of present illness Pt is a 82 yo female with h/o biliary cirrhosis and LBP who wwas admitted with 1 mo history of abdominal and LE swelling that is worsening.  pt found to have pneumonia and had thoracentesis on 2/28.  Pt to have abdominal ultrasound this afternoon 3/1.  Pt also with anasarca.  Work up in progress.   OT comments  Pt demonstrates DOE with sink level task and sitting in chair for LB dressing demo with AE. Pt not at a level to be home alone yet. Pt requires (A) to open breakfast tray containers and setup environment. Pt progressing toward goals. If daughter is unable to be home initially with patient then may need to consider higher level of care.   Follow Up Recommendations  Home health OT;Supervision/Assistance - 24 hour    Equipment Recommendations  3 in 1 bedside comode;Other (comment)    Recommendations for Other Services      Precautions / Restrictions Precautions Precautions: Fall Restrictions Weight Bearing Restrictions: No       Mobility Bed Mobility Overal bed mobility: Modified Independent             General bed mobility comments: all rails removed and incr time required. pt states I dont have any .Marland Kitchen.. then with incr time able to problem solve exiting and states "i can do it . I just had to figure it out"  Transfers Overall transfer level: Needs assistance Equipment used: Rolling walker (2 wheeled) Transfers: Sit to/from Stand Sit to Stand: Supervision         General transfer comment: cues for RW safety    Balance Overall balance assessment: History of Falls (in bathroom)                                 ADL Overall ADL's : Needs assistance/impaired Eating/Feeding: Set up;Sitting Eating/Feeding Details (indicate cue type and reason): pt reports difficulty opening containers Grooming: Wash/dry  hands;Supervision/safety;Standing Grooming Details (indicate cue type and reason): sink levle_ educated on RW placement for safety at the sink             Lower Body Dressing: Supervision/safety;Sit to/from stand Lower Body Dressing Details (indicate cue type and reason): using AE             Functional mobility during ADLs: Supervision/safety;Rolling walker General ADL Comments: Pt educated on hip kit and very pleased with demo with AE. pt plans to have daughter purchase entire kit in gift shop. Pt provided handout with picture of AE and location in [facility] ( since patient choose to purchase here and not medical supply store though given the option). pt laughing and verbalizing "that is SO GREAT" pt demonstrated DOE with all LB dressing task. Pt able to cross LE at the ankles.       Vision                     Perception     Praxis      Cognition   Behavior During Therapy: [facility] for tasks assessed/performed Overall Cognitive Status: Within Functional Limits for tasks assessed                       Extremity/Trunk Assessment  Exercises     Shoulder Instructions       General Comments      Pertinent Vitals/ Pain       Pain Assessment: No/denies pain  Home Living                                          Prior Functioning/Environment              Frequency Min 2X/week     Progress Toward Goals  OT Goals(current goals can now be found in the care plan section)  Progress towards OT goals: Progressing toward goals  Acute Rehab OT Goals Patient Stated Goal: to get well and be at home OT Goal Formulation: With patient Time For Goal Achievement: 05/26/15 Potential to Achieve Goals: Good ADL Goals Pt Will Perform Lower Body Dressing: with modified independence;sit to/from stand;with adaptive equipment Additional ADL Goal #1: Pt will walk to bathroom and toilet on 3:1 over commode with mod I. Additional ADL  Goal #2: Pt will state 3 things she can do at home to conserve energy during her daily tasks with no cues.  Plan Discharge plan remains appropriate    Co-evaluation                 End of Session Equipment Utilized During Treatment: Gait belt;Rolling walker   Activity Tolerance Patient tolerated treatment well   Patient Left in chair;with call bell/phone within reach;with nursing/sitter in room   Nurse Communication Mobility status;Precautions        Time: 2633-3545 OT Time Calculation (min): 18 min  Charges: OT General Charges $OT Visit: 1 Procedure OT Treatments $Self Care/Home Management : 8-22 mins  Parke Poisson B 05/14/2015, 9:50 AM   Jessica French   OTR/L Pager: [phone] Office: [phone] .

## 2015-05-14 NOTE — Progress Notes (Signed)
[facility]                                                                              Patient Demographics  Jessica French, is a 82 y.o. female, DOB - [birth year], [id number]  Admit date - 05/10/2015   Admitting Physician Vianne Bulls, MD  Outpatient Primary MD for the patient is Celedonio Savage, MD  LOS - 4   Chief Complaint  Patient presents with  . Pneumonia      Interim history  82 year old female with history of primary biliary cirrhosis, hypothyroidism, presented to emergency department with a one-month progressive lower extremity and abdominal swelling along with cough and fatigue. Patient was recently placed on Lasix by her primary doctor however without resolution of her symptoms. Patient being treated for pneumonia as well as anasarca. Echocardiogram pending. IR consulted for thoracentesis.  Assessment & Plan   Community acquired pneumonia -Failed outpatient treatment  -Chest x-ray showed moderate right pleural effusion with collapse/consolidation at the right lung base, recommended repeat x-rays in 3-4 weeks -Pending blood and sputum cultures -strep pneumonia and legionella urine antigens negative -Continue Rocephin and azithromycin  Right pleural effusion -Interventional radiology consulted and appreciated -Status post ultrasound-guided right thoracentesis, yielding 350 mL of cloudy fluid -LDH ration 0.5, protein 0.6-- cut off for exudates. Will follow culture. No growth to date.  Continue with IV antibiotics.   Acute Diastolic Heart Failure exacerbation:  Anasarca/lower extremity edema -Despite being on oral Lasix, swelling has not improved -Continue IV Lasix -Monitor intake and output, daily weight -Echocardiogram consistent with diastolic dysfunction.  Weight 71---69--69 [place] with finding of cirrhosis and ascites.  Continue with spironolactone.  Change lasix to TID 3-2  Chronic kidney disease, stage III -currently stable -Continue to monitor closely  due to diuresis  Hypothyroidism -Continue Synthroid  Primary biliary cirrhosis -Followed by Dr. Henrene Pastor -Continue ursodiol Start spironolactone  Anemia/thombocytopenia -Continue monitor CBC -No signs of blood loss at this time  Code Status: Full  Family Communication: daughter at bedside.   Disposition Plan: Admitted  Time Spent in minutes   30 minutes  Procedures  US guided thoracentesis Echocardiogram  Consults   Interventional radiology  DVT Prophylaxis  SCDs  Lab Results  Component Value Date   PLT 112* 05/13/2015    Medications  Scheduled Meds: . azithromycin  500 mg Oral Daily  . calcium carbonate  1,250 mg Oral Q breakfast  . cefTRIAXone (ROCEPHIN)  IV  1 g Intravenous Q24H  . ferrous sulfate  325 mg Oral Q breakfast  . fluticasone  1 spray Each Nare Daily  . folic acid  1 mg Oral Daily  . furosemide  40 mg Intravenous 3 times per day  . heparin  5,000 Units Subcutaneous 3 times per day  . levothyroxine  50 mcg Oral QAC breakfast  . lidocaine  1 patch Transdermal Q24H  . multivitamin with minerals  1 tablet Oral Daily  . sodium chloride flush  3 mL Intravenous Q12H  . spironolactone  25 mg Oral Daily  . ursodiol  300 mg Oral TID  . vitamin E  400 Units Oral Daily  . zinc sulfate  220 mg Oral Daily  Continuous Infusions:  PRN Meds:.sodium chloride, acetaminophen, calcium carbonate, hydroxypropyl methylcellulose / hypromellose, ondansetron (ZOFRAN) IV, sodium chloride flush  Antibiotics    Anti-infectives    Start     Dose/Rate Route Frequency Ordered Stop   05/14/15 1800  azithromycin (ZITHROMAX) tablet 500 mg     500 mg Oral Daily 05/14/15 0845 05/17/15 0959   05/11/15 1900  cefTRIAXone (ROCEPHIN) 1 g in dextrose 5 % 50 mL IVPB     1 g 100 mL/hr over 30 Minutes Intravenous Every 24 hours 05/10/15 2151 05/18/15 1859   05/11/15 1900  azithromycin (ZITHROMAX) 500 mg in dextrose 5 % 250 mL IVPB  Status:  Discontinued     500 mg 250 mL/hr over 60  Minutes Intravenous Every 24 hours 05/10/15 2151 05/14/15 0845   05/10/15 1945  cefTRIAXone (ROCEPHIN) 1 g in dextrose 5 % 50 mL IVPB     1 g 100 mL/hr over 30 Minutes Intravenous  Once 05/10/15 1936 05/10/15 2123   05/10/15 1945  azithromycin (ZITHROMAX) 500 mg in dextrose 5 % 250 mL IVPB     500 mg 250 mL/hr over 60 Minutes Intravenous  Once 05/10/15 1936 05/10/15 2129      Subjective:   Jessica French seen and examined today.  Feeling better , still with back pain.   Objective:   Filed Vitals:   05/13/15 1951 05/14/15 0517 05/14/15 0819 05/14/15 1200  BP: 132/50 118/43 122/52 117/50  Pulse: 72 73 74 69  Temp: 98.6 F (37 C) 98.6 F (37 C) 98.3 F (36.8 C) 98.6 F (37 C)  TempSrc: Oral Oral Oral Oral  Resp: 18 19 18 18   Height:      Weight:  69.4 kg (153 lb)    SpO2: 98% 94% 96% 96%    Wt Readings from Last 3 Encounters:  05/14/15 69.4 kg (153 lb)  04/13/15 59.875 kg (132 lb)  03/24/15 59.024 kg (130 lb 2 oz)     Intake/Output Summary (Last 24 hours) at 05/14/15 1459 Last data filed at 05/14/15 0949  Gross per 24 hour  Intake    520 ml  Output    901 ml  Net   -381 ml    Exam  General: Well developed, well nourished, NAD, appears stated age  HEENT: NCAT,  mucous membranes moist.   Cardiovascular: S1 S2 auscultated, no rubs, murmurs or gallops. Regular rate and rhythm.  Respiratory: Diminished breath sounds  Abdomen: Soft, nontender, distended, + bowel sounds  Extremities: warm dry without cyanosis clubbing. 2+ LE edema extending to the hips B/L     Data Review   Micro Results Recent Results (from the past 240 hour(s))  Culture, blood (routine x 2)     Status: None (Preliminary result)   Collection Time: 05/10/15  7:45 PM  Result Value Ref Range Status   Specimen Description BLOOD LEFT FOREARM  Final   Special Requests BOTTLES DRAWN AEROBIC AND ANAEROBIC 5CC  Final   Culture NO GROWTH 4 DAYS  Final   Report Status PENDING  Incomplete  Culture,  blood (routine x 2)     Status: None (Preliminary result)   Collection Time: 05/10/15  8:00 PM  Result Value Ref Range Status   Specimen Description BLOOD RIGHT ANTECUBITAL  Final   Special Requests BOTTLES DRAWN AEROBIC AND ANAEROBIC 10CC  Final   Culture NO GROWTH 4 DAYS  Final   Report Status PENDING  Incomplete  Culture, body fluid-bottle     Status: None (Preliminary  result)   Collection Time: 05/11/15  9:59 AM  Result Value Ref Range Status   Specimen Description FLUID RIGHT PLEURAL  Final   Special Requests BOTTLES DRAWN AEROBIC AND ANAEROBIC 10MLS  Final   Culture NO GROWTH 3 DAYS  Final   Report Status PENDING  Incomplete  Gram stain     Status: None   Collection Time: 05/11/15  9:59 AM  Result Value Ref Range Status   Specimen Description FLUID RIGHT PLEURAL  Final   Special Requests NONE  Final   Gram Stain   Final    MODERATE WBC PRESENT,BOTH PMN AND MONONUCLEAR NO ORGANISMS SEEN    Report Status 05/11/2015 FINAL  Final    Radiology Reports Dg Chest 1 View  05/11/2015  CLINICAL DATA:  Followup right pleural effusion. Status post thoracentesis. Primary biliary cirrhosis. Chronic kidney disease stage 3. EXAM: CHEST 1 VIEW COMPARISON:  05/10/2015 FINDINGS: Small to moderate right pleural effusion is decreased in size. No definite pneumothorax visualized. Tiny left pleural effusion is not significant changed. Right lower lobe atelectasis or infiltrate shows improvement. Heart size is stable. IMPRESSION: Decreased size of right pleural effusion status post thoracentesis. No definite pneumothorax visualized. Electronically Signed   By: Earle Gell M.D.   On: 05/11/2015 10:33   Dg Chest 2 View  05/10/2015  CLINICAL DATA:  Shortness of breath, cough and right-sided pain for 2 weeks. EXAM: CHEST  2 VIEW COMPARISON:  None. FINDINGS: Trachea is midline. Heart size is accentuated by low lung volumes. There is right basilar collapse/ consolidation with a moderate right pleural effusion.  Left lung is grossly clear. IMPRESSION: Moderate right pleural effusion with collapse/ consolidation at the right lung base. Followup PA and lateral chest X-ray is recommended in 3-4 weeks following trial of antibiotic therapy to ensure resolution and exclude underlying malignancy. Electronically Signed   By: Lorin Picket M.D.   On: 05/10/2015 16:48   US Abdomen Complete  05/13/2015  CLINICAL DATA:  Abdominal distention. Evaluate for ascites and cirrhosis. EXAM: ABDOMEN ULTRASOUND COMPLETE COMPARISON:  May 11, 2015 FINDINGS: Gallbladder: The gallbladder wall is thickened. This is secondary to adjacent ascites. No gallstones are noted. No sonographic Murphy sign noted by sonographer. Common bile duct: Diameter: 4.5 mm Liver: There is nodular contour of the liver. The echotexture of the liver is heterogeneous. IVC: No abnormality visualized. Pancreas: Visualized portion unremarkable. Spleen: Size and appearance within normal limits. Right Kidney: Length: 10 cm. Echogenicity within normal limits. No mass or hydronephrosis visualized. Left Kidney: Length: 9.7 cm. Echogenicity within normal limits. No mass or hydronephrosis visualized. Abdominal aorta: No aneurysm visualized. Other findings: Ascites and bilateral pleural effusions are identified. IMPRESSION: Findings consistent with cirrhosis of liver. Ascites and bilateral pleural effusions noted. Electronically Signed   By: Abelardo Diesel M.D.   On: 05/13/2015 08:44   US Thoracentesis Asp Pleural Space W/img Guide  05/11/2015  INDICATION: Symptomatic right sided pleural effusion. EXAM: US THORACENTESIS ASP PLEURAL SPACE W/IMG GUIDE COMPARISON:  None. MEDICATIONS: 10 cc 1% lidocaine COMPLICATIONS: None immediate. TECHNIQUE: Informed written consent was obtained from the patient after a discussion of the risks, benefits and alternatives to treatment. A timeout was performed prior to the initiation of the procedure. Initial ultrasound scanning demonstrates a  right pleural effusion. The lower chest was prepped and draped in the usual sterile fashion. 1% lidocaine was used for local anesthesia. Under direct ultrasound guidance, a 19 gauge, 7-cm, Yueh catheter was introduced. An ultrasound image was saved for documentation  purposes. The thoracentesis was performed. The catheter was removed and a dressing was applied. The patient tolerated the procedure well without immediate post procedural complication. The patient was escorted to have an upright chest radiograph. FINDINGS: A total of approximately 350 cc of cloudy fluid was removed. Requested samples were sent to the laboratory. IMPRESSION: Successful ultrasound-guided Rt sided thoracentesis yielding 350 cc of pleural fluid. Read by:  Lavonia Drafts [facility] Electronically Signed   By: Markus Daft M.D.   On: 05/11/2015 10:18    CBC  Recent Labs Lab 05/10/15 1622 05/11/15 0231 05/12/15 0532 05/13/15 0451  WBC 4.9 4.5 3.3* 4.5  HGB 10.5* 10.3* 8.9* 9.0*  HCT 32.3* 29.9* 27.4* 27.5*  PLT 121* 104* 102* 112*  MCV 96.1 95.2 95.5 95.5  MCH 31.3 32.8 31.0 31.3  MCHC 32.5 34.4 32.5 32.7  RDW 15.0 15.0 14.7 15.0  LYMPHSABS  --  1.3  --   --   MONOABS  --  0.8  --   --   EOSABS  --  0.2  --   --   BASOSABS  --  0.0  --   --     Chemistries   Recent Labs Lab 05/10/15 1622 05/10/15 2015 05/10/15 2343 05/11/15 0231 05/12/15 0532 05/13/15 0451 05/14/15 0954  NA 141  --   --  141 140 139 138  K 4.1  --   --  3.8 3.6 4.0 3.8  CL 108  --   --  107 106 104 98*  CO2 21*  --   --  25 26 24 27   GLUCOSE 100*  --   --  120* 79 96 166*  BUN 35*  --   --  35* 34* 34* 31*  CREATININE 1.15*  --   --  1.24* 1.19* 1.23* 1.37*  CALCIUM 8.9  --   --  8.6* 8.4* 8.5* 8.9  MG  --   --  1.9  --   --   --   --   AST  --  35  --  31  --   --   --   ALT  --  21  --  20  --   --   --   ALKPHOS  --  91  --  103  --   --   --   BILITOT  --  0.9  --  0.7  --   --   --     ------------------------------------------------------------------------------------------------------------------ estimated creatinine clearance is 26 mL/min (by C-G formula based on Cr of 1.37). ------------------------------------------------------------------------------------------------------------------ No results for input(s): HGBA1C in the last 72 hours. ------------------------------------------------------------------------------------------------------------------ No results for input(s): CHOL, HDL, LDLCALC, TRIG, CHOLHDL, LDLDIRECT in the last 72 hours. ------------------------------------------------------------------------------------------------------------------  Recent Labs  05/13/15 0453  T3FREE 1.7*   ------------------------------------------------------------------------------------------------------------------ No results for input(s): VITAMINB12, FOLATE, FERRITIN, TIBC, IRON, RETICCTPCT in the last 72 hours.  Coagulation profile  Recent Labs Lab 05/10/15 2343  INR 1.30    No results for input(s): DDIMER in the last 72 hours.  Cardiac Enzymes  Recent Labs Lab 05/12/15 1115 05/12/15 1646 05/13/15 0001  TROPONINI <0.03 <0.03 <0.03   ------------------------------------------------------------------------------------------------------------------ Invalid input(s): POCBNP    Regalado, Belkys A D.O. on 05/14/2015 at 2:59 PM  Between 7am to 7pm - Pager - [phone]  After 7pm go to [email or web address] - password [email or web address]  And look for the night coverage person covering for me after hours  [facility] Office  [phone]

## 2015-05-15 DIAGNOSIS — N183 Chronic kidney disease, stage 3 (moderate): Secondary | ICD-10-CM

## 2015-05-15 LAB — BASIC METABOLIC PANEL
ANION GAP: 9 (ref 5–15)
BUN: 33 mg/dL — AB (ref 6–20)
CALCIUM: 8.4 mg/dL — AB (ref 8.9–10.3)
CO2: 25 mmol/L (ref 22–32)
Chloride: 104 mmol/L (ref 101–111)
Creatinine, Ser: 1.31 mg/dL — ABNORMAL HIGH (ref 0.44–1.00)
GFR calc Af Amer: 41 mL/min — ABNORMAL LOW (ref >60)
GFR, EST NON AFRICAN AMERICAN: 36 mL/min — AB (ref >60)
GLUCOSE: 88 mg/dL (ref 65–99)
POTASSIUM: 3.8 mmol/L (ref 3.5–5.1)
Sodium: 138 mmol/L (ref 135–145)

## 2015-05-15 LAB — CULTURE, BLOOD (ROUTINE X 2)
Culture: NO GROWTH
Culture: NO GROWTH

## 2015-05-15 MED ORDER — LIDOCAINE 5 % EX PTCH: 1.0000 | MEDICATED_PATCH | CUTANEOUS | Status: DC

## 2015-05-15 MED ORDER — SPIRONOLACTONE 50 MG PO TABS: 50.0000 mg | ORAL_TABLET | Freq: Every day | ORAL | Status: DC

## 2015-05-15 MED ORDER — FUROSEMIDE 40 MG PO TABS: 40.0000 mg | ORAL_TABLET | Freq: Two times a day (BID) | ORAL | Status: AC

## 2015-05-15 NOTE — Discharge Instructions (Signed)
Take lasix twice a day, you can take second dose of lasix in the afternoon at 3 PM.  Please follow up with PCP next week

## 2015-05-15 NOTE — Discharge Summary (Signed)
Physician Discharge Summary  Jessica French ZYS:[id number] DOB: 09-Feb-1927 DOA: 05/10/2015  PCP: Celedonio Savage, MD  Admit date: 05/10/2015 Discharge date: 05/15/2015  Time spent: 35 minutes  Recommendations for Outpatient Follow-up:  Titrate diuretics as needed.  Needs Bmet to follow renal function.   Discharge Diagnoses:    Acute Diastolic Heart Failure exacerbation   Anasarca/lower extremity edema; suspect related to HF and cirrhosis   CAP (community acquired pneumonia)   Primary biliary cirrhosis (HCC)   Pleural effusion, right   Hypothyroidism   CKD (chronic kidney disease) stage 3, GFR 30-59 ml/min   Normocytic anemia   Thrombocytopenia ([place])   Anasarca   Discharge Condition: stable  Diet recommendation: heart healthy   Filed Weights   05/13/15 0419 05/14/15 0517 05/15/15 0623  Weight: 69.582 kg (153 lb 6.4 oz) 69.4 kg (153 lb) 68.266 kg (150 lb 8 oz)    History of present illness:  Jessica French is a 82 y.o. female with PMH of primary biliary cirrhosis, hypothyroidism, and chronic low back pain who presents to the ED with 1 month of progressive lower extremity and abdominal swelling with cough and malaise. Patient reports noting the development of bilateral lower extremity edema approximately 1 month ago and was placed on Lasix by her PCP. Has edema continued to progress, she reports that the Lasix dose was increased, but without any appreciable effect. Since that time, patient reports lower extremity edema to the hips as well as swelling of her abdomen. She notes concomitant development of generalized weakness, malaise, and nonproductive cough. She was evaluated by her primary care physician approximately one week ago with x-ray and placed on an antibiotic given radiographic evidence of pneumonia. Cough, weakness, and malaise continued to progress despite the antibiotic and when she was evaluated by her PCP again today, he directed her to the ED for further evaluation.   ED, patient  was found to be afebrile, saturating adequately on room air, and with vital signs stable. Chest x-ray is significant for a moderate right-sided pleural effusion with associated consolidation at the right base concerning for pneumonia. Initial blood work returns notable for a hemoglobin of 10.5, platelet count of 121,000, and BNP of 240. Blood cultures were obtained in the ED and the patient was treated empirically with Rocephin and azithromycin. There was concern for volume overload and a single dose of 40 mg IV Lasix was administered. Patient remained hemodynamically stable in the emergency department and the hospitalists were called upon to admit for ongoing evaluation and management of suspected CAP and anasarca of unknown etiology.   Hospital Course:    Interim history  82 year old female with history of primary biliary cirrhosis, hypothyroidism, presented to emergency department with a one-month progressive lower extremity and abdominal swelling along with cough and fatigue. Patient was recently placed on Lasix by her primary doctor however without resolution of her symptoms. Patient being treated for pneumonia as well as anasarca. Echocardiogram pending. IR consulted for thoracentesis.  Assessment & Plan   Community acquired pneumonia -Failed outpatient treatment  -Chest x-ray showed moderate right pleural effusion with collapse/consolidation at the right lung base, recommended repeat x-rays in 3-4 weeks -strep pneumonia and legionella urine antigens negative -received Rocephin and azithromycin IV for 6 days. No further antibiotics needed.   Right pleural effusion -Interventional radiology consulted and appreciated -Status post ultrasound-guided right thoracentesis, yielding 350 mL of cloudy fluid -LDH ration 0.5, protein 0.6-- cut off for exudates. Will follow culture. No growth to date.  Treated  with 6 days of IV antibiotics.  Suspect pleural effusion secondary to HF and cirrhosis.    Acute Diastolic Heart Failure exacerbation:  Anasarca/lower extremity edema; suspect related to HF and cirrhosis;  -Despite being on oral Lasix, swelling has not improved -Continue IV Lasix -Monitor intake and output, daily weight -Echocardiogram consistent with diastolic dysfunction.  Weight 71---69--69--68 [place] with finding of cirrhosis and ascites.  Continue with spironolactone. increased dose 3-3.  9 pounds down, LE decreasing, dyspnea improved, abdominal distension decreasing.  Plan to discharge on lasix BID and spironolactone daily. Needs B-met to follow renal function   Chronic kidney disease, stage III -currently stable -Continue to monitor closely due to diuresis  Hypothyroidism -Continue Synthroid  Primary biliary cirrhosis -Followed by Dr. Henrene Pastor -Continue ursodiol Started  spironolactone  Anemia/thombocytopenia -Continue monitor CBC -No signs of blood loss at this time       Procedures: US guided thoracentesis Echocardiogram  Consultations:  none  Discharge Exam: Filed Vitals:   05/14/15 2046 05/15/15 0623  BP: 117/45 121/55  Pulse: 73 70  Temp: 99 F (37.2 C) 98.3 F (36.8 C)  Resp: 18 18    General: NAD Cardiovascular: S 1, S 2 RRR Respiratory: CTA LE; plus 1 edema  Discharge Instructions   Discharge Instructions    Diet - low sodium heart healthy    Complete by:  As directed      Increase activity slowly    Complete by:  As directed           Current Discharge Medication List    START taking these medications   Details  lidocaine (LIDODERM) 5 % Place 1 patch onto the skin daily. Remove & Discard patch within 12 hours or as directed by MD Qty: 30 patch, Refills: 0    spironolactone (ALDACTONE) 50 MG tablet Take 1 tablet (50 mg total) by mouth daily. Qty: 30 tablet, Refills: 0      CONTINUE these medications which have CHANGED   Details  furosemide (LASIX) 40 MG tablet Take 1 tablet (40 mg total) by mouth 2 (two) times  daily. Qty: 30 tablet, Refills: 0      CONTINUE these medications which have NOT CHANGED   Details  Biotin 5000 MCG CAPS Take 5,000 mcg by mouth daily at 12 noon.    Calcium Carbonate (CALCIUM 600 PO) Take 1 tablet by mouth daily.    calcium carbonate (TUMS - DOSED IN MG ELEMENTAL CALCIUM) 500 MG chewable tablet Chew 1 tablet by mouth as needed for indigestion or heartburn.     carboxymethylcellulose (REFRESH PLUS) 0.5 % SOLN Place 1 drop into both eyes 3 (three) times daily as needed (dry eyes).     !! Coenzyme Q10 (COQ10) 100 MG CAPS Take 100 mg by mouth daily at 12 noon.    Cyanocobalamin (VITAMIN B-12 IJ) Inject 1,000 mcg as directed every 30 (thirty) days.    Ferrous Sulfate 143 (45 Fe) MG TBCR Take 143 mg by mouth daily at 12 noon.    folic acid (FOLVITE) 665 MCG tablet Take 400 mcg by mouth daily.    levothyroxine (SYNTHROID, LEVOTHROID) 50 MCG tablet Take 50 mcg by mouth daily before breakfast.    MAGNESIUM PO Take 250 mg by mouth daily.     Multiple Vitamins-Minerals (CENTRUM SILVER PO) Take 1 tablet by mouth daily.    ursodiol (ACTIGALL) 300 MG capsule Take 1 capsule (300 mg total) by mouth 3 (three) times daily. Qty: 270 capsule, Refills: 3  vitamin E 400 UNIT capsule Take 1 capsule (400 Units total) by mouth daily. Qty: 30 capsule, Refills: 3    Zinc 50 MG CAPS Take 50 mg by mouth daily.     !! co-enzyme Q-10 30 MG capsule Take 30 mg by mouth daily. Reported on 05/10/2015    hydrocortisone (ANUSOL-HC) 2.5 % rectal cream Place 1 application rectally 2 (two) times daily. Qty: 30 g, Refills: 1     !! - Potential duplicate medications found. Please discuss with provider.    STOP taking these medications     doxycycline (MONODOX) 100 MG capsule      MILK THISTLE PO      Misc Natural Products (OSTEO BI-FLEX ADV DOUBLE ST) CAPS      Multiple Vitamins-Minerals (PRESERVISION AREDS 2) CAPS      naproxen sodium (ALEVE) 220 MG tablet      hydrocortisone  (ANUSOL-HC) 25 MG suppository      Na Sulfate-K Sulfate-Mg Sulf SOLN      ferrous sulfate 325 (65 FE) MG tablet      mometasone (NASONEX) 50 MCG/ACT nasal spray        Allergies  Allergen Reactions  . Tobramycin     EYE DROPS--"burning like fire"   Follow-up Information    Follow up with [facility].   Why:  3 in 1, Rollator   Contact information:   [address] [place] [address] [phone]       Follow up with [facility].   Why:  HHPT, HHOT   Contact information:   [address] [place] [address] [phone]       Follow up with Celedonio Savage, MD.   Specialty:  Family Medicine   Contact information:   [address] [place] [address] [phone]        The results of significant diagnostics from this hospitalization (including imaging, microbiology, ancillary and laboratory) are listed below for reference.    Significant Diagnostic Studies: Dg Chest 1 View  05/11/2015  CLINICAL DATA:  Followup right pleural effusion. Status post thoracentesis. Primary biliary cirrhosis. Chronic kidney disease stage 3. EXAM: CHEST 1 VIEW COMPARISON:  05/10/2015 FINDINGS: Small to moderate right pleural effusion is decreased in size. No definite pneumothorax visualized. Tiny left pleural effusion is not significant changed. Right lower lobe atelectasis or infiltrate shows improvement. Heart size is stable. IMPRESSION: Decreased size of right pleural effusion status post thoracentesis. No definite pneumothorax visualized. Electronically Signed   By: Earle Gell M.D.   On: 05/11/2015 10:33   Dg Chest 2 View  05/10/2015  CLINICAL DATA:  Shortness of breath, cough and right-sided pain for 2 weeks. EXAM: CHEST  2 VIEW COMPARISON:  None. FINDINGS: Trachea is midline. Heart size is accentuated by low lung volumes. There is right basilar collapse/ consolidation with a moderate right pleural effusion. Left lung is grossly clear.  IMPRESSION: Moderate right pleural effusion with collapse/ consolidation at the right lung base. Followup PA and lateral chest X-ray is recommended in 3-4 weeks following trial of antibiotic therapy to ensure resolution and exclude underlying malignancy. Electronically Signed   By: Lorin Picket M.D.   On: 05/10/2015 16:48   US Abdomen Complete  05/13/2015  CLINICAL DATA:  Abdominal distention. Evaluate for ascites and cirrhosis. EXAM: ABDOMEN ULTRASOUND COMPLETE COMPARISON:  May 11, 2015 FINDINGS: Gallbladder: The gallbladder wall is thickened. This is secondary to adjacent ascites. No gallstones are noted. No sonographic  Murphy sign noted by sonographer. Common bile duct: Diameter: 4.5 mm Liver: There is nodular contour of the liver. The echotexture of the liver is heterogeneous. IVC: No abnormality visualized. Pancreas: Visualized portion unremarkable. Spleen: Size and appearance within normal limits. Right Kidney: Length: 10 cm. Echogenicity within normal limits. No mass or hydronephrosis visualized. Left Kidney: Length: 9.7 cm. Echogenicity within normal limits. No mass or hydronephrosis visualized. Abdominal aorta: No aneurysm visualized. Other findings: Ascites and bilateral pleural effusions are identified. IMPRESSION: Findings consistent with cirrhosis of liver. Ascites and bilateral pleural effusions noted. Electronically Signed   By: Abelardo Diesel M.D.   On: 05/13/2015 08:44   US Thoracentesis Asp Pleural Space W/img Guide  05/11/2015  INDICATION: Symptomatic right sided pleural effusion. EXAM: US THORACENTESIS ASP PLEURAL SPACE W/IMG GUIDE COMPARISON:  None. MEDICATIONS: 10 cc 1% lidocaine COMPLICATIONS: None immediate. TECHNIQUE: Informed written consent was obtained from the patient after a discussion of the risks, benefits and alternatives to treatment. A timeout was performed prior to the initiation of the procedure. Initial ultrasound scanning demonstrates a right pleural effusion. The  lower chest was prepped and draped in the usual sterile fashion. 1% lidocaine was used for local anesthesia. Under direct ultrasound guidance, a 19 gauge, 7-cm, Yueh catheter was introduced. An ultrasound image was saved for documentation purposes. The thoracentesis was performed. The catheter was removed and a dressing was applied. The patient tolerated the procedure well without immediate post procedural complication. The patient was escorted to have an upright chest radiograph. FINDINGS: A total of approximately 350 cc of cloudy fluid was removed. Requested samples were sent to the laboratory. IMPRESSION: Successful ultrasound-guided Rt sided thoracentesis yielding 350 cc of pleural fluid. Read by:  Lavonia Drafts [facility] Electronically Signed   By: Markus Daft M.D.   On: 05/11/2015 10:18    Microbiology: Recent Results (from the past 240 hour(s))  Culture, blood (routine x 2)     Status: None (Preliminary result)   Collection Time: 05/10/15  7:45 PM  Result Value Ref Range Status   Specimen Description BLOOD LEFT FOREARM  Final   Special Requests BOTTLES DRAWN AEROBIC AND ANAEROBIC 5CC  Final   Culture NO GROWTH 4 DAYS  Final   Report Status PENDING  Incomplete  Culture, blood (routine x 2)     Status: None (Preliminary result)   Collection Time: 05/10/15  8:00 PM  Result Value Ref Range Status   Specimen Description BLOOD RIGHT ANTECUBITAL  Final   Special Requests BOTTLES DRAWN AEROBIC AND ANAEROBIC 10CC  Final   Culture NO GROWTH 4 DAYS  Final   Report Status PENDING  Incomplete  Culture, body fluid-bottle     Status: None (Preliminary result)   Collection Time: 05/11/15  9:59 AM  Result Value Ref Range Status   Specimen Description FLUID RIGHT PLEURAL  Final   Special Requests BOTTLES DRAWN AEROBIC AND ANAEROBIC 10MLS  Final   Culture NO GROWTH 3 DAYS  Final   Report Status PENDING  Incomplete  Gram stain     Status: None   Collection Time: 05/11/15  9:59 AM  Result Value Ref Range  Status   Specimen Description FLUID RIGHT PLEURAL  Final   Special Requests NONE  Final   Gram Stain   Final    MODERATE WBC PRESENT,BOTH PMN AND MONONUCLEAR NO ORGANISMS SEEN    Report Status 05/11/2015 FINAL  Final     Labs: Basic Metabolic Panel:  Recent Labs Lab 05/10/15 2343 05/11/15 0231  05/12/15 0532 05/13/15 0451 05/14/15 0954 05/15/15 0542  NA  --  141 140 139 138 138  K  --  3.8 3.6 4.0 3.8 3.8  CL  --  107 106 104 98* 104  CO2  --  _0 GLUCOSE  --  120* 79 96 166* 88  BUN  --  35* 34* 34* 31* 33*  CREATININE  --  1.24* 1.19* 1.23* 1.37* 1.31*  CALCIUM  --  8.6* 8.4* 8.5* 8.9 8.4*  MG 1.9  --   --   --   --   --    Liver Function Tests:  Recent Labs Lab 05/10/15 2015 05/11/15 0231  AST 35 31  ALT 21 20  ALKPHOS 91 103  BILITOT 0.9 0.7  PROT 5.7* 5.1*  ALBUMIN 2.8* 2.7*   No results for input(s): LIPASE, AMYLASE in the last 168 hours. No results for input(s): AMMONIA in the last 168 hours. CBC:  Recent Labs Lab 05/10/15 1622 05/11/15 0231 05/12/15 0532 05/13/15 0451  WBC 4.9 4.5 3.3* 4.5  NEUTROABS  --  2.2  --   --   HGB 10.5* 10.3* 8.9* 9.0*  HCT 32.3* 29.9* 27.4* 27.5*  MCV 96.1 95.2 95.5 95.5  PLT 121* 104* 102* 112*   Cardiac Enzymes:  Recent Labs Lab 05/12/15 1115 05/12/15 1646 05/13/15 0001  TROPONINI <0.03 <0.03 <0.03   BNP: BNP (last 3 results)  Recent Labs  05/10/15 1636  BNP 237.2*    ProBNP (last 3 results) No results for input(s): PROBNP in the last 8760 hours.  CBG: No results for input(s): GLUCAP in the last 168 hours.     Signed:  Elmarie Shiley MD.  [facility] 05/15/2015, 10:43 AM

## 2015-05-15 NOTE — Progress Notes (Signed)
CM received call from RN notifying of discharge and need for rollator.  CM called [facility] rep, Tiffany and notified of discharge.  [facility] providing HHRN/OT/PT.  CM called [facility] DME rep, Merry Proud to please delive rthe rollator to room so pt can discharge.  No other CM needs were communciated.

## 2015-05-16 LAB — CULTURE, BODY FLUID W GRAM STAIN -BOTTLE: Culture: NO GROWTH

## 2015-05-16 LAB — CULTURE, BODY FLUID-BOTTLE

## 2015-06-22 ENCOUNTER — Telehealth: Payer: Self-pay | Admitting: Internal Medicine

## 2015-06-22 NOTE — Telephone Encounter (Signed)
Discussed with pt that the hemorrhoid was not going to go away but that if her symptoms have subsided she can stop the cream. Pt aware and was appreciative of the call.

## 2015-07-07 ENCOUNTER — Encounter: Payer: Self-pay | Admitting: Gastroenterology

## 2015-07-07 ENCOUNTER — Ambulatory Visit (INDEPENDENT_AMBULATORY_CARE_PROVIDER_SITE_OTHER): Payer: Medicare Other | Admitting: Gastroenterology

## 2015-07-07 VITALS — BP 124/68 | HR 66 | Ht 65.0 in | Wt 131.0 lb

## 2015-07-07 DIAGNOSIS — R188 Other ascites: Secondary | ICD-10-CM | POA: Diagnosis not present

## 2015-07-07 DIAGNOSIS — K743 Primary biliary cirrhosis: Secondary | ICD-10-CM | POA: Diagnosis not present

## 2015-07-07 MED ORDER — SPIRONOLACTONE 50 MG PO TABS: 50.0000 mg | ORAL_TABLET | Freq: Two times a day (BID) | ORAL | Status: AC

## 2015-07-07 NOTE — Patient Instructions (Addendum)
You have been scheduled for an abdominal ultrasound Paracenteses  at [facility] (1st floor of hospital) on Monday 5-1-2017at 10:00 am. Please arrive at 9:45 am prior to your appointment for registration.  Should you need to reschedule your appointment, please contact radiology at [phone].  Come back for labs on 07-15-2015 to our lab, basement level.    Spironalactone 50 mg , 1 tablet twice daily. We sent a prescription to [facility], [place].  We made you an appointment with Dr. Scarlette Shorts for 08-12-2015 at 10:45 am.

## 2015-07-12 ENCOUNTER — Ambulatory Visit (HOSPITAL_COMMUNITY)
Admission: RE | Admit: 2015-07-12 | Discharge: 2015-07-12 | Disposition: A | Discharge status: Home / Self Care | Payer: Medicare Other | Source: Ambulatory Visit | Attending: Gastroenterology | Admitting: Gastroenterology

## 2015-07-12 DIAGNOSIS — K743 Primary biliary cirrhosis: Secondary | ICD-10-CM | POA: Insufficient documentation

## 2015-07-12 DIAGNOSIS — R188 Other ascites: Secondary | ICD-10-CM | POA: Insufficient documentation

## 2015-07-12 LAB — BODY FLUID CELL COUNT WITH DIFFERENTIAL
LYMPHS FL: 37 %
MONOCYTE-MACROPHAGE-SEROUS FLUID: 62 % (ref 50–90)
NEUTROPHIL FLUID: 1 % (ref 0–25)
Total Nucleated Cell Count, Fluid: 546 cu mm (ref 0–1000)

## 2015-07-12 LAB — ALBUMIN, FLUID (OTHER): Albumin, Fluid: <1 g/dL

## 2015-07-12 LAB — GRAM STAIN

## 2015-07-12 NOTE — Procedures (Signed)
Ultrasound-guided diagnostic and therapeutic paracentesis performed yielding 1.2  liters of turbid, light yellow  fluid. No immediate complications. A portion of the fluid was sent to the lab for preordered studies.

## 2015-07-15 ENCOUNTER — Other Ambulatory Visit (INDEPENDENT_AMBULATORY_CARE_PROVIDER_SITE_OTHER): Payer: Medicare Other

## 2015-07-15 DIAGNOSIS — K743 Primary biliary cirrhosis: Secondary | ICD-10-CM

## 2015-07-15 DIAGNOSIS — R188 Other ascites: Secondary | ICD-10-CM | POA: Insufficient documentation

## 2015-07-15 LAB — CBC WITH DIFFERENTIAL/PLATELET
BASOS ABS: 0 10*3/uL (ref 0.0–0.1)
Basophils Relative: 0.3 % (ref 0.0–3.0)
Eosinophils Absolute: 0.2 10*3/uL (ref 0.0–0.7)
Eosinophils Relative: 3.1 % (ref 0.0–5.0)
HCT: 35.7 % — ABNORMAL LOW (ref 36.0–46.0)
HEMOGLOBIN: 12 g/dL (ref 12.0–15.0)
LYMPHS ABS: 1.1 10*3/uL (ref 0.7–4.0)
Lymphocytes Relative: 22 % (ref 12.0–46.0)
MCHC: 33.6 g/dL (ref 30.0–36.0)
MCV: 95.3 fl (ref 78.0–100.0)
Monocytes Absolute: 0.9 10*3/uL (ref 0.1–1.0)
Monocytes Relative: 16.3 % — ABNORMAL HIGH (ref 3.0–12.0)
NEUTROS PCT: 58.3 % (ref 43.0–77.0)
Neutro Abs: 3 10*3/uL (ref 1.4–7.7)
Platelets: 165 10*3/uL (ref 150.0–400.0)
RBC: 3.75 Mil/uL — AB (ref 3.87–5.11)
RDW: 15 % (ref 11.5–15.5)
WBC: 5.2 10*3/uL (ref 4.0–10.5)

## 2015-07-15 LAB — BASIC METABOLIC PANEL
BUN: 29 mg/dL — ABNORMAL HIGH (ref 6–23)
CHLORIDE: 102 meq/L (ref 96–112)
CO2: 29 meq/L (ref 19–32)
Calcium: 9.3 mg/dL (ref 8.4–10.5)
Creatinine, Ser: 1.25 mg/dL — ABNORMAL HIGH (ref 0.40–1.20)
GFR: 43.09 mL/min — ABNORMAL LOW (ref >60.00)
GLUCOSE: 172 mg/dL — AB (ref 70–99)
Potassium: 4.2 mEq/L (ref 3.5–5.1)
SODIUM: 139 meq/L (ref 135–145)

## 2015-07-15 NOTE — Progress Notes (Signed)
     07/07/2015 Jessica French [id number] [birth year]   History of Present Illness:  This is an 82 year old female with PBC that now follows with Dr. Henrene Pastor (used to follow at [facility]).  Comes in today complaining of distended abdomen and some discomfort associated with it.  Has ascites and B/L pleural effusions as seen on recent ultrasound.  Has small volume ascites in 12/2014, but patient says that this has gotten worse in the past couple of months.  Her weight is the same as it was in 03/2015.  She has moderate diastolic dysfunction of her heart as well by recent ECHO.  Still complaining of some cough and SOB at times since hospitalization for PNA in March. Lasix 40 mg twice a day and looks like that she was discharged from the hospital in March on spironolactone 50 daily as well, but is no longer taking that.   Current Medications, Allergies, Past Medical History, Past Surgical History, Family History and Social History were reviewed in [employer] record.   Physical Exam: BP 124/68 mmHg  Pulse 66  Ht 5\' 5"  (1.651 m)  Wt 131 lb (59.421 kg)  BMI 21.80 kg/m2 General: Well developed white female in no acute distress Head: Normocephalic and atraumatic Eyes:  Sclerae anicteric, conjunctiva pink  Ears: Normal auditory acuity Lungs: Clear throughout to auscultation Heart: Regular rate and rhythm Abdomen: Soft, somewhat distended from ascites fluid.  BS present.  Non-tender. Musculoskeletal: Symmetrical with no gross deformities  Extremities:  Slight edema in B/L LE's Neurological: Alert oriented x 4, grossly non-focal Psychological:  Alert and cooperative. Normal mood and affect  Assessment and Recommendations: -82 year old female with PBC and portal hypertension. Now has possibly some worsening ascites questionably related to heart failure or worsening decompensation of her cirrhosis. She will continue the Lasix 40 mg twice daily and we will add spironolactone back at 50 mg  twice daily to start. We will schedule her for ultrasound guided paracentesis with only 2-3 L to be removed. We will send fluid for cell count, culture and Gram stain, cytology, and albumin. We will repeat a BMP and CBC next week and we'll check an AFP as well.  Follow-up with Dr. Henrene Pastor in one month.

## 2015-07-16 LAB — AFP TUMOR MARKER: AFP-Tumor Marker: 5.5 ng/mL (ref <6.1)

## 2015-07-17 LAB — CULTURE, BODY FLUID-BOTTLE: CULTURE: NO GROWTH

## 2015-07-17 LAB — CULTURE, BODY FLUID W GRAM STAIN -BOTTLE

## 2015-07-19 ENCOUNTER — Telehealth: Payer: Self-pay | Admitting: Internal Medicine

## 2015-07-19 NOTE — Progress Notes (Signed)
Noted. Agree with plans. Labs paracentesis results reviewed.

## 2015-07-19 NOTE — Telephone Encounter (Signed)
Pt having increased abdominal pain and weakness. Pt requests to be seen sooner that 1st available, states she feels miserable. Pt scheduled to see Nicoletta Ba PA 07/21/15@9am . Pt aware of appt.

## 2015-07-21 ENCOUNTER — Ambulatory Visit: Payer: Medicare Other | Admitting: Physician Assistant

## 2015-08-02 ENCOUNTER — Other Ambulatory Visit: Payer: Self-pay | Admitting: Internal Medicine

## 2015-08-12 ENCOUNTER — Ambulatory Visit (INDEPENDENT_AMBULATORY_CARE_PROVIDER_SITE_OTHER): Payer: Medicare Other | Admitting: Internal Medicine

## 2015-08-12 ENCOUNTER — Encounter: Payer: Self-pay | Admitting: Internal Medicine

## 2015-08-12 VITALS — BP 112/54 | HR 76 | Ht 60.5 in | Wt 120.5 lb

## 2015-08-12 DIAGNOSIS — J90 Pleural effusion, not elsewhere classified: Secondary | ICD-10-CM | POA: Diagnosis not present

## 2015-08-12 DIAGNOSIS — R188 Other ascites: Secondary | ICD-10-CM

## 2015-08-12 DIAGNOSIS — K743 Primary biliary cirrhosis: Secondary | ICD-10-CM | POA: Diagnosis not present

## 2015-08-12 NOTE — Progress Notes (Signed)
HISTORY OF PRESENT ILLNESS:  Jessica French is a 82 y.o. female with multiple medical problems including primary biliary cirrhosis for which she is followed in this office, bleeding hemorrhoids for which she underwent colonoscopy January 0000000, diastolic heart failure with right pleural effusion, and chronic renal insufficiency. She was hospitalized in March with symptomatic shortness of breath. She was found to have heart failure with bilateral pleural effusions and ascites. She was treated and improved. Based on diuretics. She was last seen in this clinic 07/07/2015 by Jessica French. See that dictation. At that time, Lasix was continued at 40 mg twice daily and Aldactone added to 50 mg twice daily. Her weight was 131 pounds. She has been monitoring her weight at home. She brings the data with her for review. Weight today here is 120 pounds. At some point a few weeks back she stopped Aldactone and decrease Lasix to 20 mg daily. No problems with abdominal fluid. She does have chronic ankle edema for many years. This is stable. No further problems with bleeding hemorrhoids. Upper GI complaints include nausea, early satiety, and fluctuating weight. She has multiple questions listed on paper and  wishes data review. She did undergo paracentesis on May 1. Small amount of ascites noted. 1.2 L removed. Albumin less than 1. No SBP. Negative cytology. Laboratories have been stable with chronic renal insufficiency. I reviewed all of this data with her. She is accompanied by her daughter.  REVIEW OF SYSTEMS:  All non-GI ROS negative except for arthritis, back pain, hearing problems, sleeping problems, chronic ankle edema, increased thirst, shortness of breath  Past Medical History  Diagnosis Date  . PBC (primary biliary cirrhosis) ([place])   . Hypothyroidism     Dx before age 11  . Hypocalcemia   . GERD (gastroesophageal reflux disease)   . Vitamin B12 deficiency   . Colon polyps     uncertain pathology  . Scoliosis   .  Chronic low back pain   . Scoliosis   . Female bladder prolapse   . Anemia   . Arthritis   . Ascites   . Portal hypertension ([place])   . Diverticulosis     Past Surgical History  Procedure Laterality Date  . Bladder surgery      prolapse, multiple  . Abdominal hysterectomy    . Appendectomy    . Colonoscopy N/A 04/13/2015    Procedure: COLONOSCOPY;  Surgeon: Irene Shipper, MD;  Location: [facility];  Service: Endoscopy;  Laterality: N/A;    Social History Jessica French  reports that she has never smoked. She has never used smokeless tobacco. She reports that she drinks alcohol. She reports that she does not use illicit drugs.  family history includes Breast cancer in her maternal aunt; Diabetes in her father; Heart disease in her brother; Leukemia in her mother; Stomach cancer in her maternal grandmother.  Allergies  Allergen Reactions  . Tobramycin     EYE DROPS--"burning like fire"       PHYSICAL EXAMINATION: Vital signs: BP 112/54 mmHg  Pulse 76  Ht 5' 0.5" (1.537 m)  Wt 120 lb 8 oz (54.658 kg)  BMI 23.14 kg/m2  Constitutional: Chronically ill-appearing, elderly, frail, no acute distress Psychiatric: alert and oriented x3, cooperative Eyes: extraocular movements intact, anicteric, conjunctiva pink Mouth: oral pharynx moist, no lesions Neck: supple no lymphadenopathy Cardiovascular: heart regular rate and rhythm, no murmur Lungs: Absent breath sounds and dullness to percussion in the lower third of the right lung. Otherwise, clear  to auscultation bilaterally Abdomen: soft, nontender, nondistended, no obvious ascites, no peritoneal signs, normal bowel sounds, no organomegaly Rectal: Omitted. Hemorrhoids in January Extremities: no clubbing or cyanosis. 2+ woody/pitting lower extremity edema bilaterally Skin: no lesions on visible extremities. Dry skin Neuro: Hearing loss. No focal deficits. No asterixis.   ASSESSMENT:  #1. Problems with volume overload requiring  hospitalization in March. I feel this was principally secondary to heart failure but magnified by underlying cirrhosis (for which she has been compensated for many years) and renal insufficiency. Has responded diuretics. Persistent pleural effusion on physical exam. #2. History of primary biliary cirrhosis. On ursodiol. Has been stable for years. Cirrhosis by ultrasound earlier this year without splenomegaly #3. History of colon polyps and bleeding hemorrhoids. Last colonoscopy January 2017  PLAN:  #1. Continue furosemide 20 mg daily as only diuretic #2. Seek attention with your PCP for recurrent worsening of breath. If severe codes ER #3. Continue ursodiol #4. Low sodium diet #5. Routine GI follow-up 6 months #6. Contact the office in the interim for relevant questions or problems   40 minutes has been spent face-to-face with the patient. Greater than 50% a time use for counseling regarding her liver disease, reviewing data, and answering multiple GI and non-GI questions. As well, I pulled her daughter aside to let her know that her mother is currently stable but given her age and combination of medical problems could become seriously ill quickly and any time. She was appreciative of the information

## 2015-08-12 NOTE — Patient Instructions (Signed)
Please follow up in 6 months.

## 2015-08-23 ENCOUNTER — Telehealth: Payer: Self-pay | Admitting: Internal Medicine

## 2015-08-23 NOTE — Telephone Encounter (Signed)
Pt states her abdomen is more distended and swollen, reports that it is painful. Pt is requesting to be seen. Pt scheduled to see Alonza Bogus PA 08/25/15@2pm . Pt aware of appt.

## 2015-08-24 ENCOUNTER — Encounter: Payer: Self-pay | Admitting: *Deleted

## 2015-08-25 ENCOUNTER — Ambulatory Visit (INDEPENDENT_AMBULATORY_CARE_PROVIDER_SITE_OTHER): Payer: Medicare Other | Admitting: Gastroenterology

## 2015-08-25 ENCOUNTER — Encounter: Payer: Self-pay | Admitting: Gastroenterology

## 2015-08-25 VITALS — BP 130/50 | HR 72 | Ht 60.0 in | Wt 128.0 lb

## 2015-08-25 DIAGNOSIS — R600 Localized edema: Secondary | ICD-10-CM | POA: Insufficient documentation

## 2015-08-25 DIAGNOSIS — K743 Primary biliary cirrhosis: Secondary | ICD-10-CM

## 2015-08-25 DIAGNOSIS — R188 Other ascites: Secondary | ICD-10-CM

## 2015-08-25 NOTE — Progress Notes (Signed)
     08/25/2015 Jessica French [id number] September 08, 1926   History of Present Illness:  This is an 82 year old female known to Dr. Henrene Pastor for following of her primary biliary cirrhosis for which she takes ursodiol. She's had associated ascites and lower extremity edema. She was just seen by him 2 weeks ago. She presents to our office today with her daughter with complaints of worsening lower extremity edema. Her weight is up 8 pounds as compared to her visit 2 weeks ago. She also complains of her abdomen feeling bloated and some associated shortness of breath. She shows me her left foot that appears very edematous and ecchymotic over her toes and forefoot. Denies pain or injury. She is currently only taking Lasix 20 mg daily for her fluid management.  Her diuretics have been adjusted on several occasions and this has been very complicated due to her intolerance to these, complaining of several side effects including nausea/feeling sick with inability to eat, dizziness, etc.   Current Medications, Allergies, Past Medical History, Past Surgical History, Family History and Social History were reviewed in [employer] record.   Physical Exam: BP 130/50 mmHg  Pulse 72  Ht 5' (1.524 m)  Wt 128 lb (58.06 kg)  BMI 25.00 kg/m2 General: Frail, elderly white female in no acute distress Head: Normocephalic and atraumatic Eyes:  Sclerae anicteric, conjunctiva pink  Ears: Normal auditory acuity Lungs: Clear throughout to auscultation Heart: Regular rate and rhythm Abdomen: Soft, no obvious ascites.  BS present.  Non-tender. Musculoskeletal: Symmetrical with no gross deformities  Extremities: 2-3+ woody/pitting edema in B/L LE's.  Ecchymosis noted over left toes and forefoot. Neurological: Alert oriented x 4, grossly non-focal.  Positive HOH. Psychological:  Alert and cooperative. Normal mood and affect  Assessment and Recommendations: -Ascites and lower extremity edema likely both in  part due to her PBC and also likely a component of heart failure. Unfortunately she is very complicated since she is intolerant to the diuretics, complaining of several side effects even at low doses. I have discussed with her extensively low-sodium/2 g sodium diet.  I have asked her to add spironolactone 50 mg daily back to her Lasix 20 mg daily and that if she could even take both of them every other day.  I have asked her to also get an appointment with cardiology as well to see if they have any further recommendations or input regarding fluid management. -PBC:  Continue ursodiol. -Left foot ecchymosis:  Please see your PCP about this as soon as possible.  Patient denies injury or pain.  ? If this is just due to swelling/edema.

## 2015-08-25 NOTE — Patient Instructions (Signed)
COntinue lasix 20 mg daily.  Restart Spironolactone 50 gm daily or every other day.   Follown up with your Primary Care Physician.  Stay on a 2 gm sodium diet.

## 2015-08-26 NOTE — Progress Notes (Signed)
Discussed with PA. Agree

## 2015-10-12 DEATH — deceased

## 2017-08-03 IMAGING — DX DG CHEST 1V
1 series · 1 of 1 positions shown · non-contrast
Comparison: 05/10/2015

CLINICAL DATA: Followup right pleural effusion. Status post
thoracentesis. Primary biliary cirrhosis. Chronic kidney disease
stage 3.

EXAM:
CHEST 1 VIEW

[chest pa]
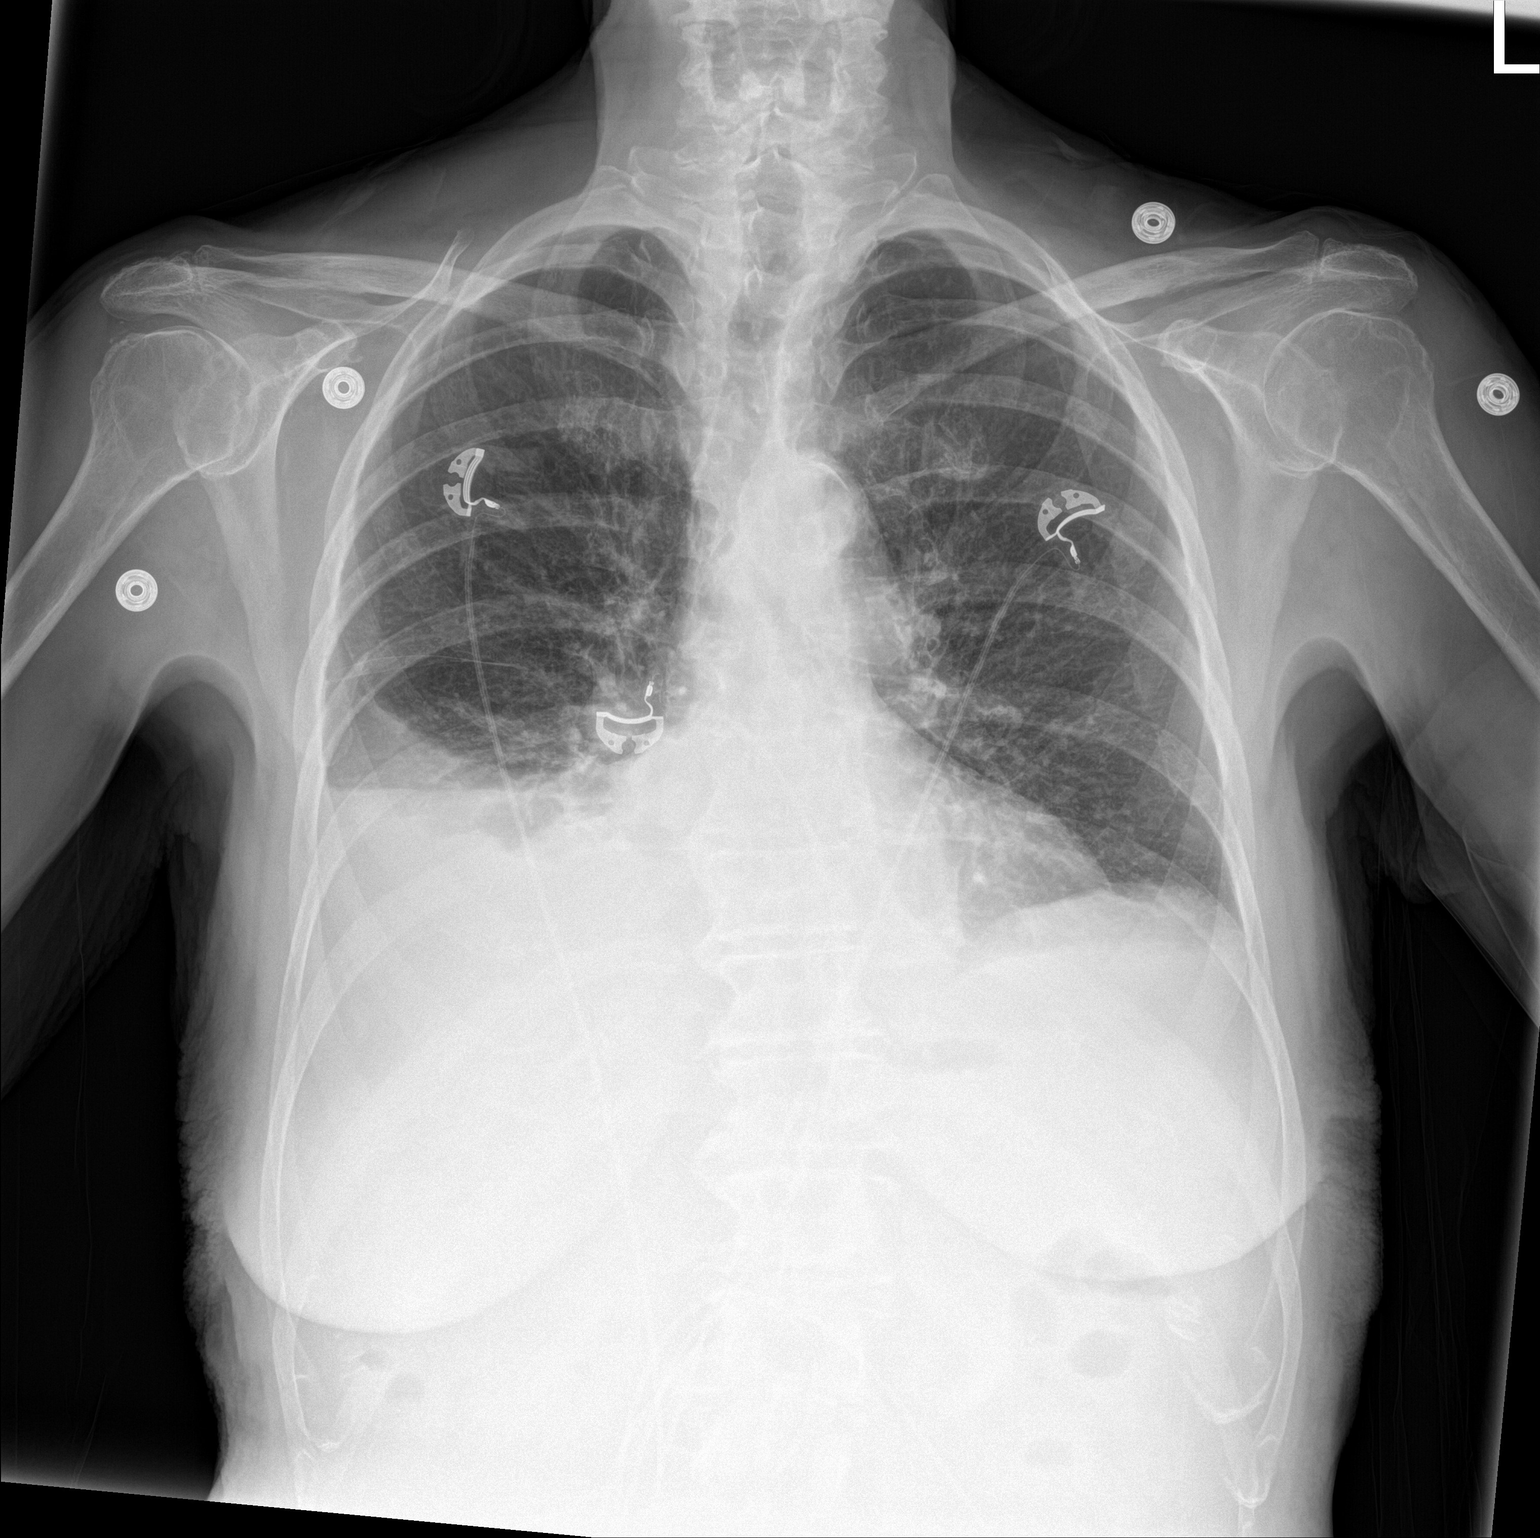

[1 of 1 positions shown; findings below may reference images not displayed]

FINDINGS: Small to moderate right pleural effusion is decreased in size. No
definite pneumothorax visualized. Tiny left pleural effusion is not
significant changed. Right lower lobe atelectasis or infiltrate
shows improvement. Heart size is stable.
IMPRESSION: Decreased size of right pleural effusion status post thoracentesis.
No definite pneumothorax visualized.

## 2017-10-04 IMAGING — US US PARACENTESIS
1 series · 5 of 5 positions shown · non-contrast
Comparison: none

INDICATION: Primary biliary cirrhosis, ascites. Request is made for diagnostic
and therapeutic paracentesis.

[Series 1: us paracentesis · 0.26mm/px · 5 of 5 slices shown]
[im 1/5]
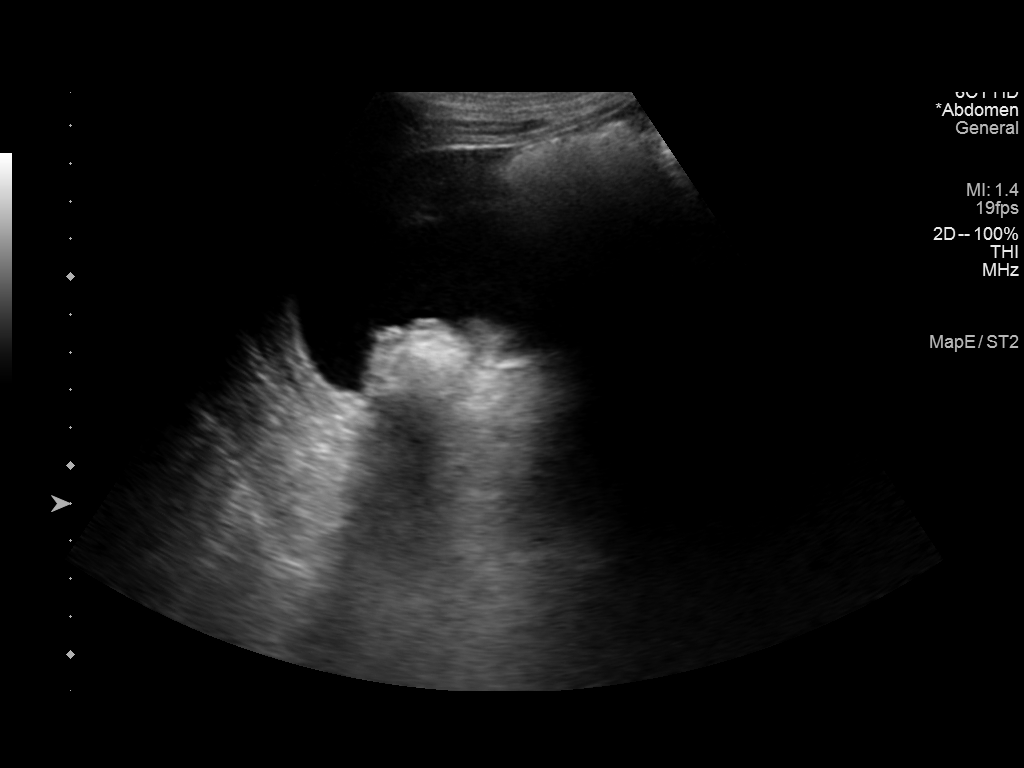
[im 2/5]
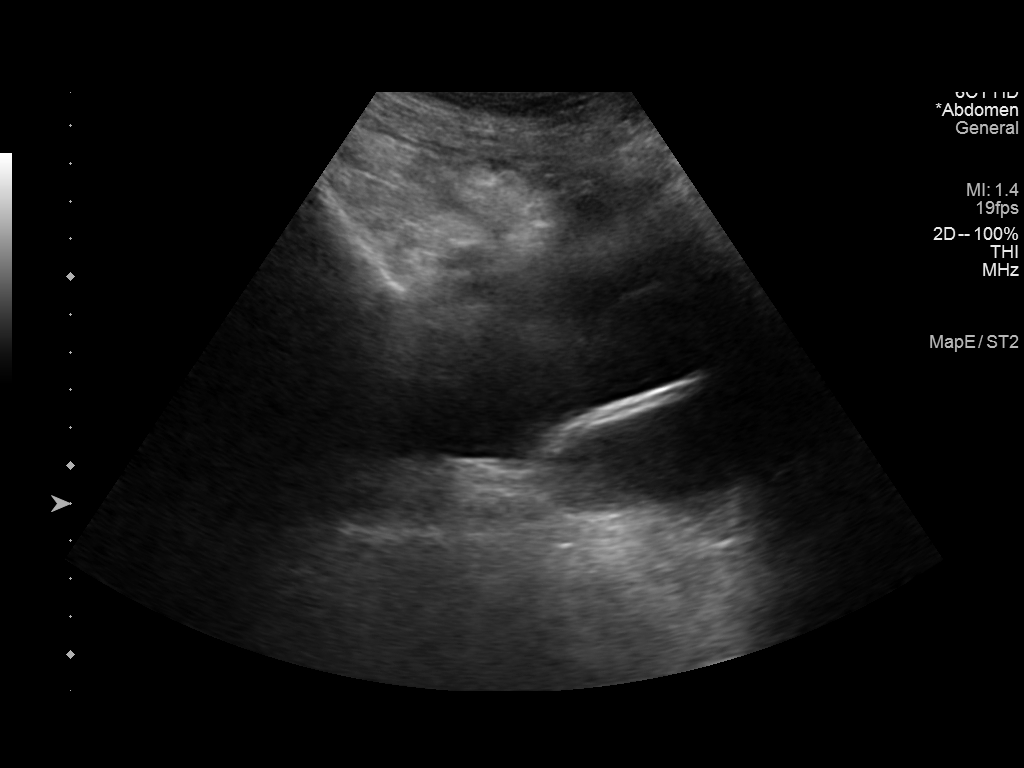
[im 3/5]
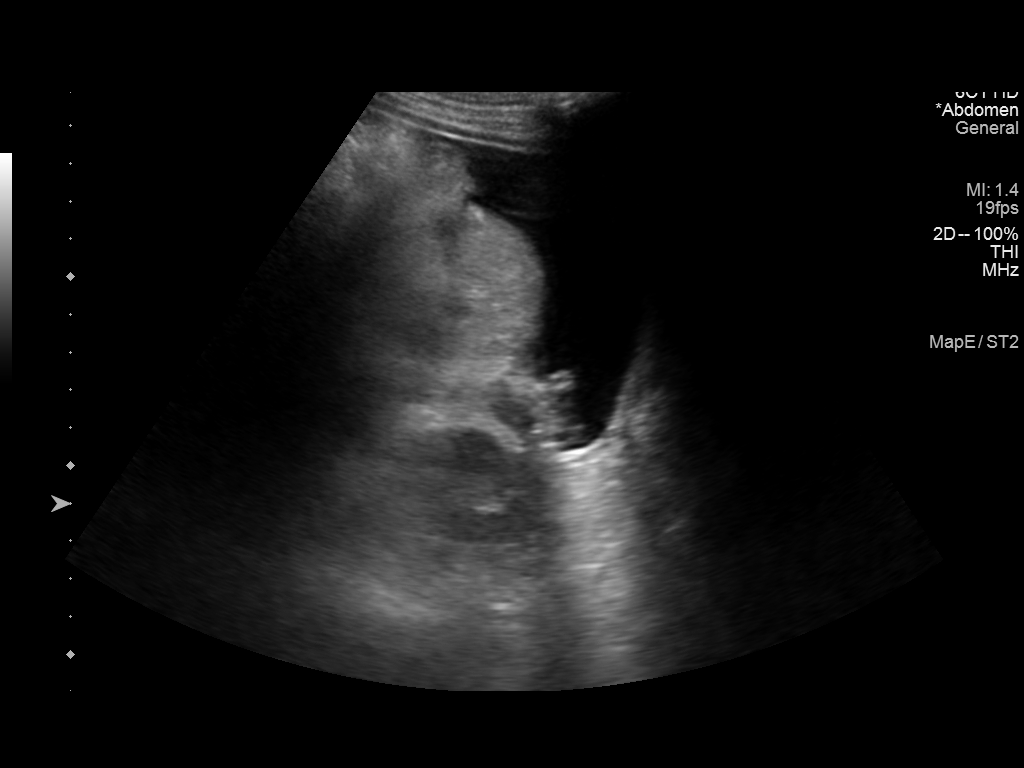
[im 4/5]
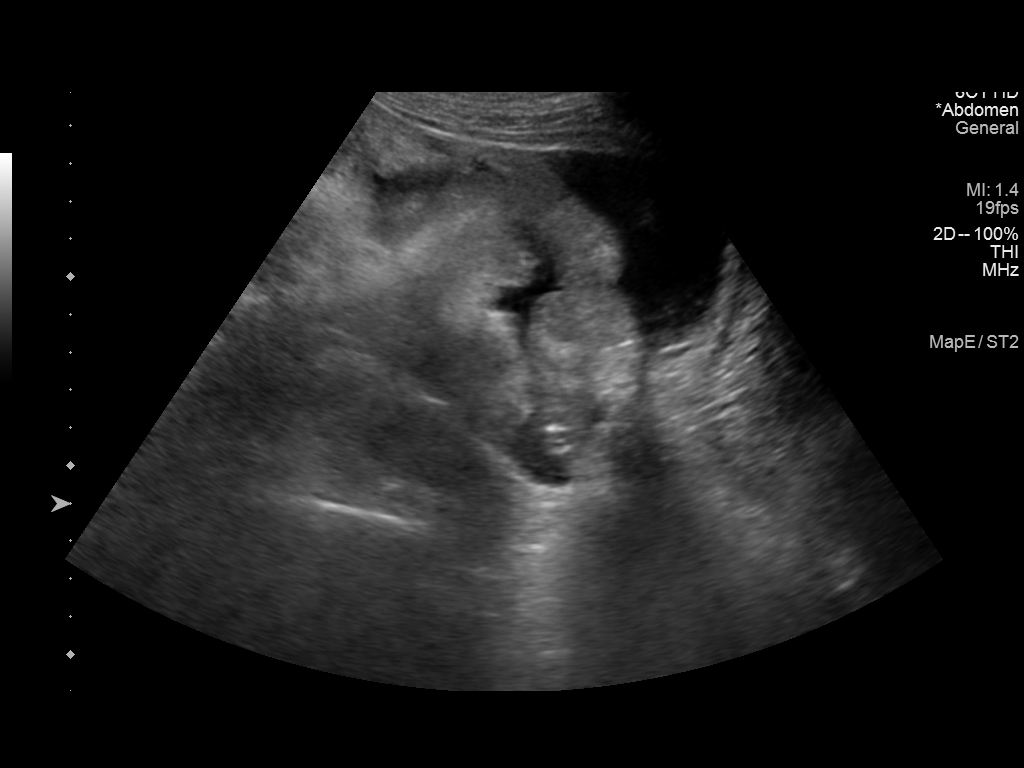
[im 5/5]
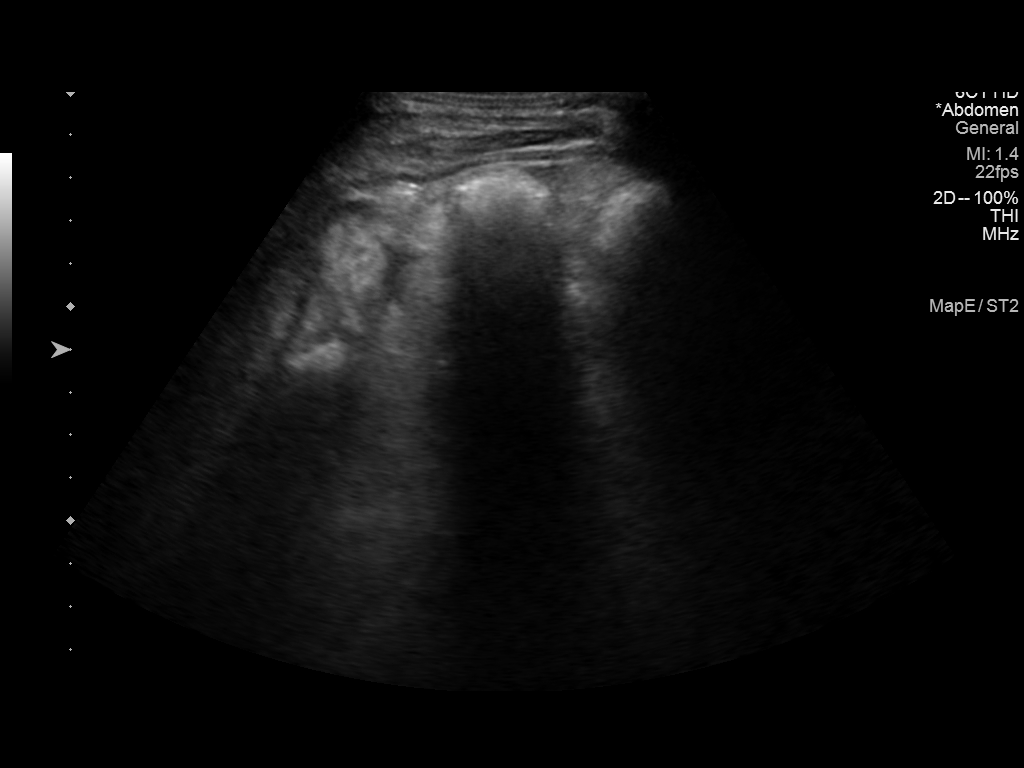

[5 of 5 positions shown; findings below may reference images not displayed]

EXAM:
ULTRASOUND GUIDED DIAGNOSTIC AND THERAPEUTIC PARACENTESIS

MEDICATIONS:
None.

COMPLICATIONS:
None immediate.

PROCEDURE:
Informed written consent was obtained from the patient after a
discussion of the risks, benefits and alternatives to treatment. A
timeout was performed prior to the initiation of the procedure.

Initial ultrasound scanning demonstrates a small amount of ascites
within the right mid to lower abdominal quadrant. The right mid to
lower abdomen was prepped and draped in the usual sterile fashion.
1% lidocaine was used for local anesthesia.

Following this, a Yueh catheter was introduced. An ultrasound image
was saved for documentation purposes. The paracentesis was
performed. The catheter was removed and a dressing was applied. The
patient tolerated the procedure well without immediate post
procedural complication.
FINDINGS: A total of approximately 1.2 liters of turbid, light yellow fluid
was removed. Samples were sent to the laboratory as requested by the
clinical team.
IMPRESSION: Successful ultrasound-guided diagnostic and therapeutic paracentesis
yielding 1.2 liters of peritoneal fluid.

## 2018-01-06 IMAGING — US US THORACENTESIS ASP PLEURAL SPACE W/IMG GUIDE
1 series · 5 of 5 positions shown · non-contrast
Comparison: None.

MEDICATIONS:
10 cc 1% lidocaine

COMPLICATIONS:
None immediate.

INDICATION: Symptomatic right sided pleural effusion.

EXAM:
US THORACENTESIS ASP PLEURAL SPACE W/IMG GUIDE
TECHNIQUE: Informed written consent was obtained from the patient after a
discussion of the risks, benefits and alternatives to treatment. A
timeout was performed prior to the initiation of the procedure.

[Series 1: us thoracentesis asp pleural space w/img guide · 0.26mm/px · 5 acquisitions, 5 frames shown]
[im 1/5]
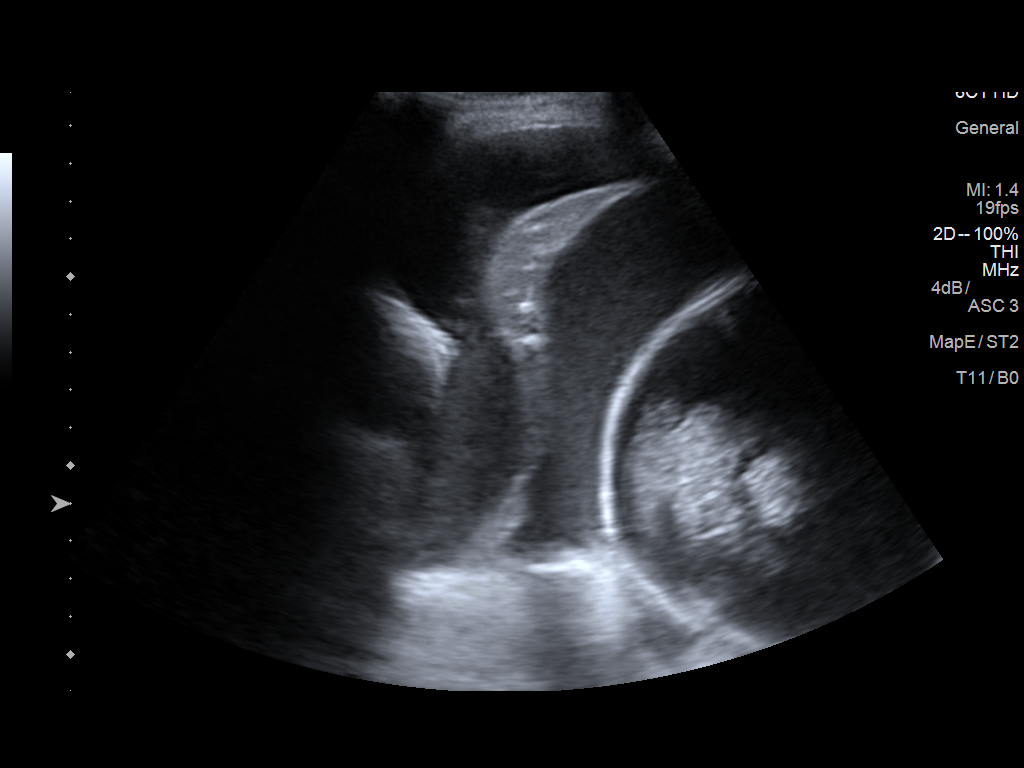
[im 2/5]
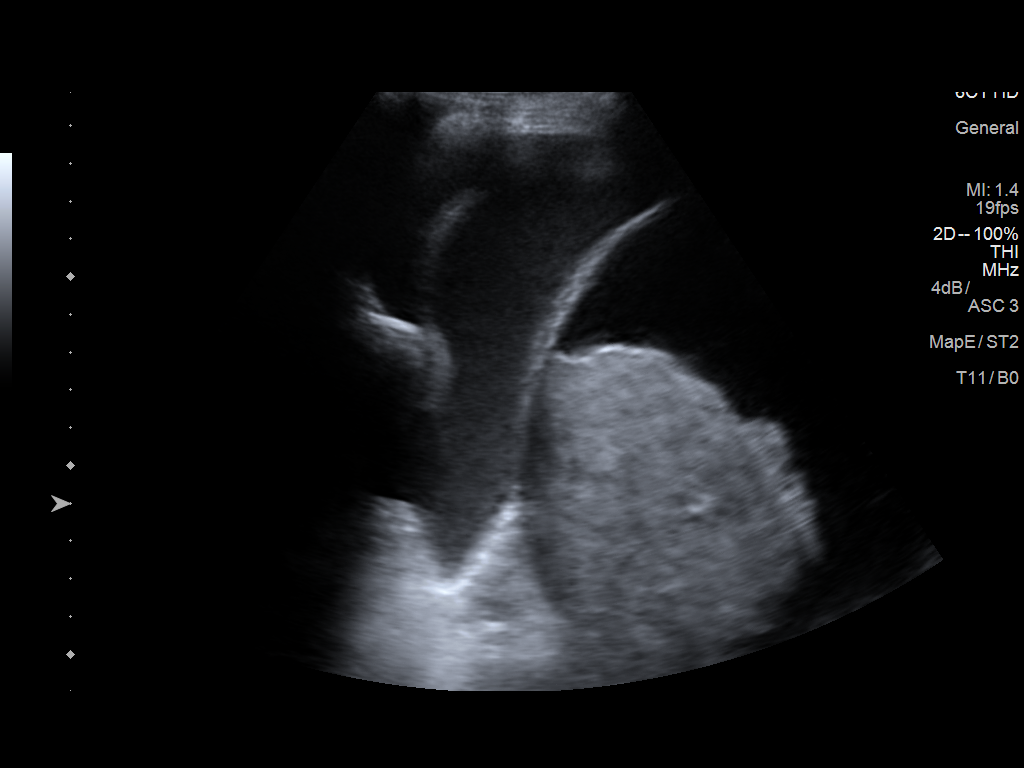
[im 3/5]
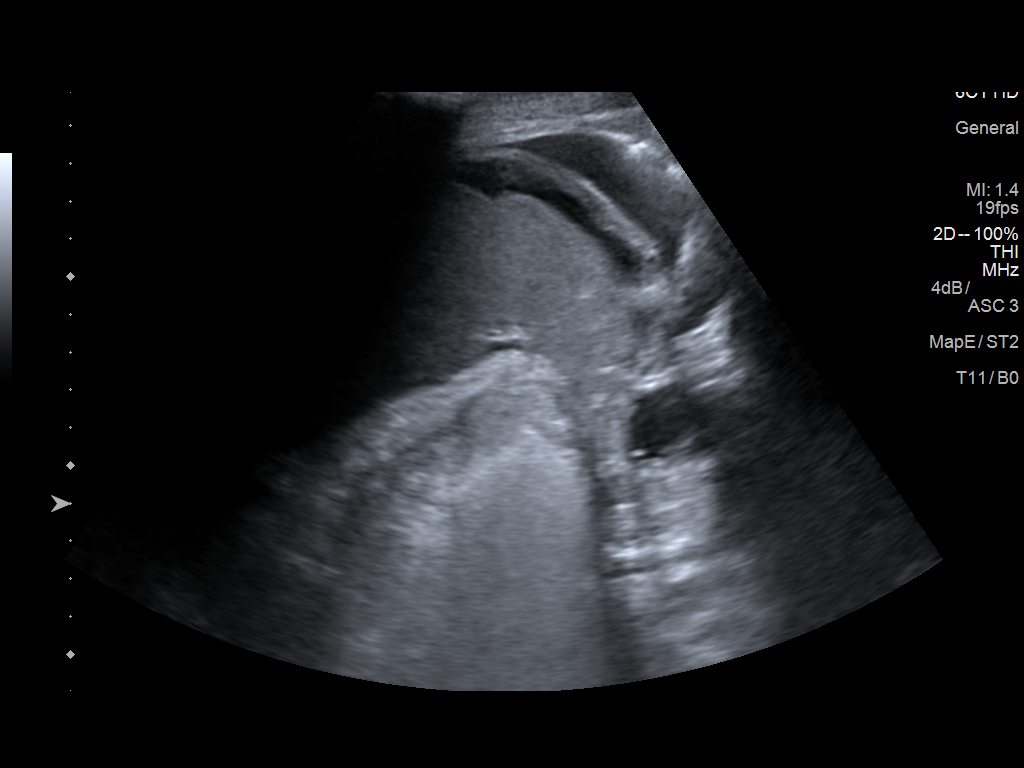
[im 4/5]
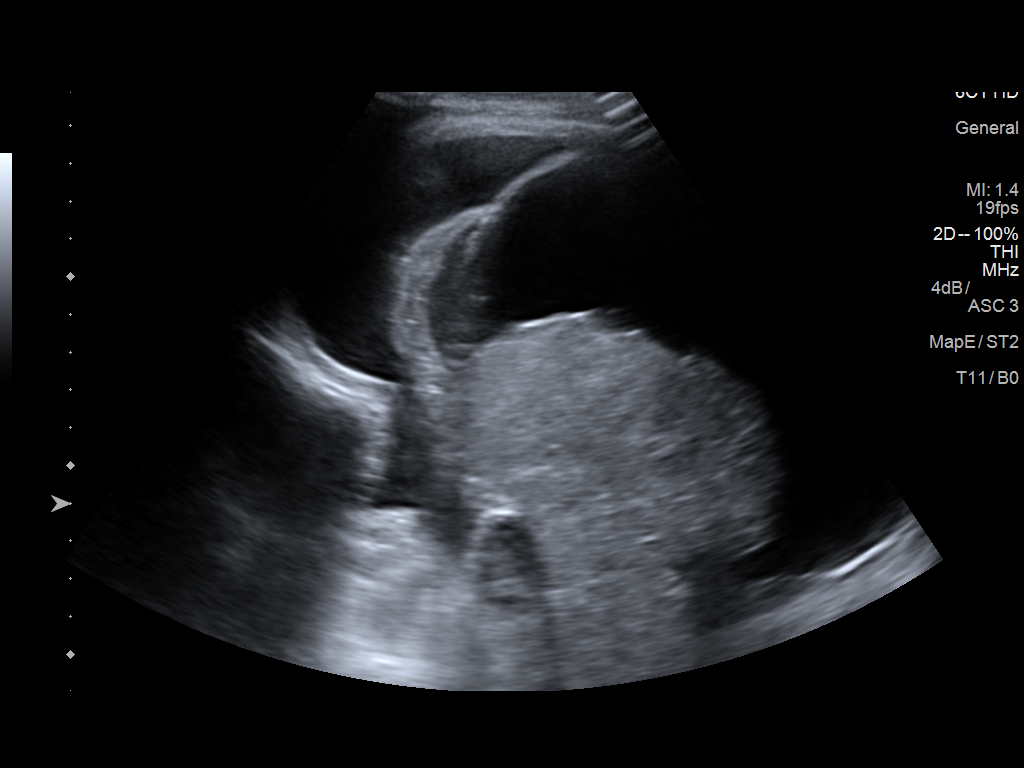
[im 5/5]
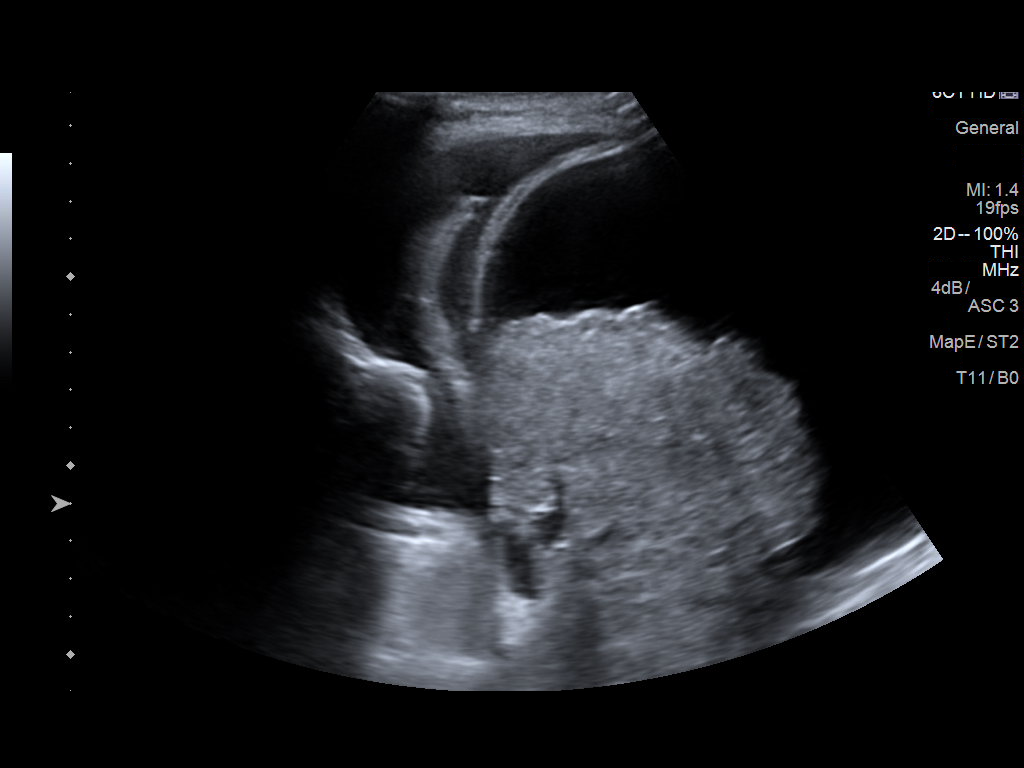

[5 of 5 positions shown; findings below may reference images not displayed]

Initial ultrasound scanning demonstrates a right pleural effusion.
The lower chest was prepped and draped in the usual sterile fashion.
1% lidocaine was used for local anesthesia.

Under direct ultrasound guidance, a 19 gauge, 7-cm, Yueh catheter
was introduced. An ultrasound image was saved for documentation
purposes. The thoracentesis was performed. The catheter was removed
and a dressing was applied. The patient tolerated the procedure well
without immediate post procedural complication. The patient was
escorted to have an upright chest radiograph.
FINDINGS: A total of approximately 350 cc of cloudy fluid was removed.
Requested samples were sent to the laboratory.
IMPRESSION: Successful ultrasound-guided Rt sided thoracentesis yielding 350 cc
of pleural fluid.

Read by:  Dinata Suaya

## 2018-01-08 IMAGING — US US ABDOMEN COMPLETE
1 series · 14 of 25 positions shown · non-contrast
Comparison: May 11, 2015

CLINICAL DATA: Abdominal distention. Evaluate for ascites and
cirrhosis.

EXAM:
ABDOMEN ULTRASOUND COMPLETE

[Series 1: us abdomen complete · 0.25mm/px · 14 of 71 slices shown]
[im 1/71]
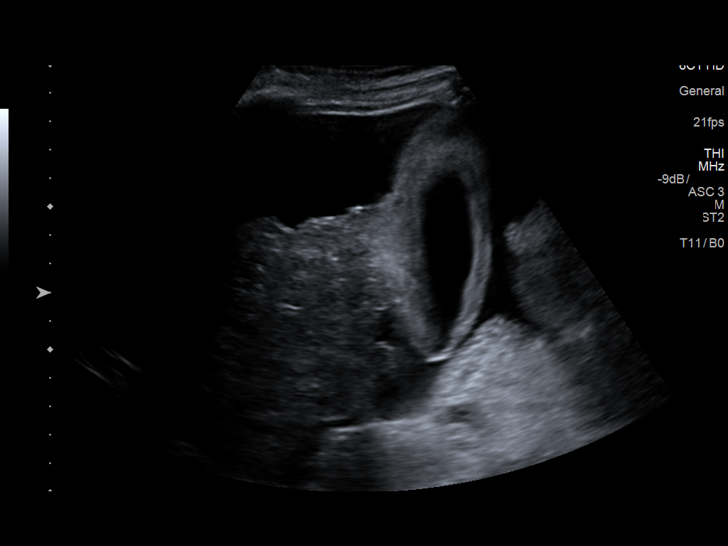
[im 6/71]
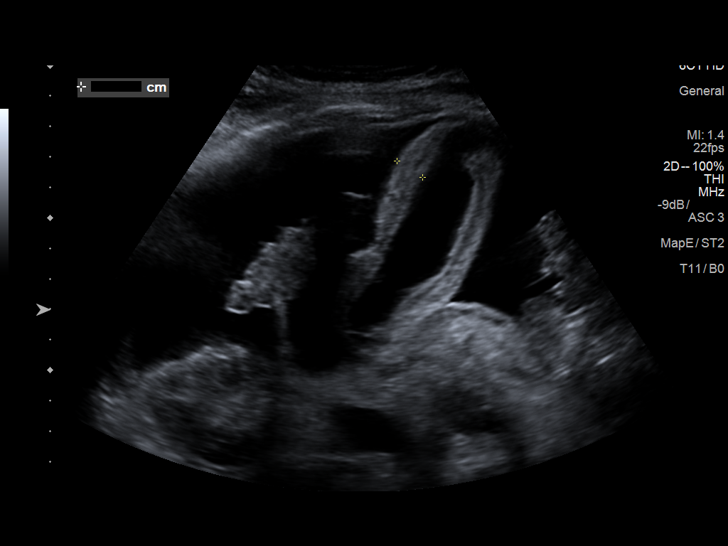
[im 12/71]
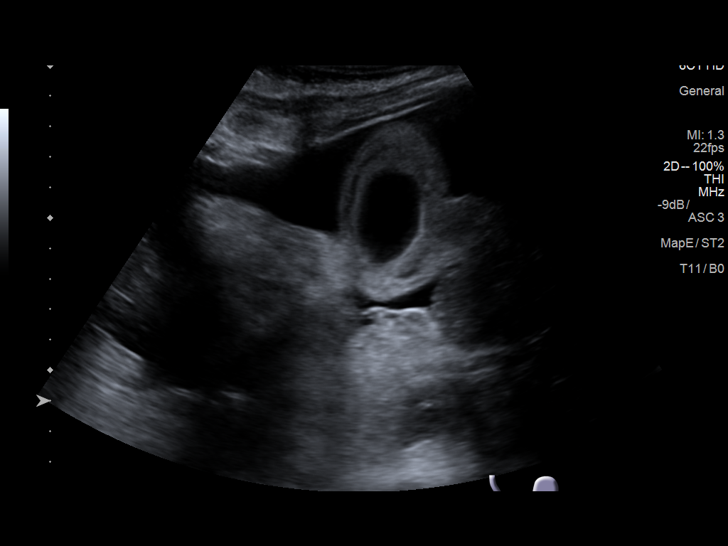
[im 18/71]
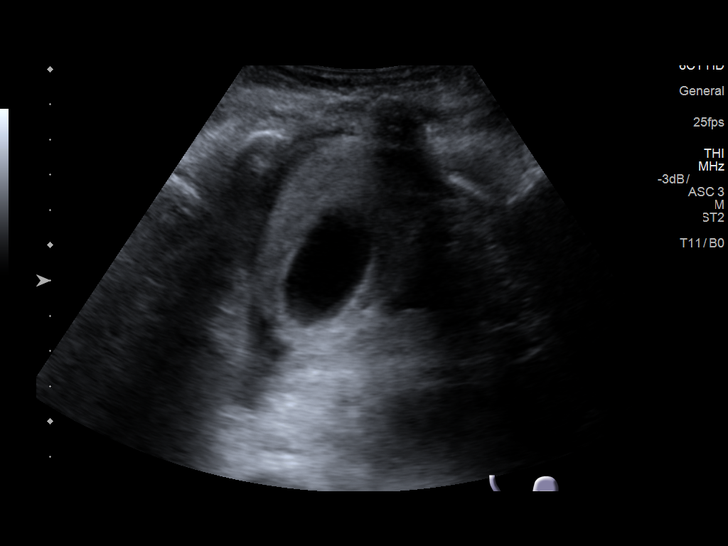
[im 24/71]
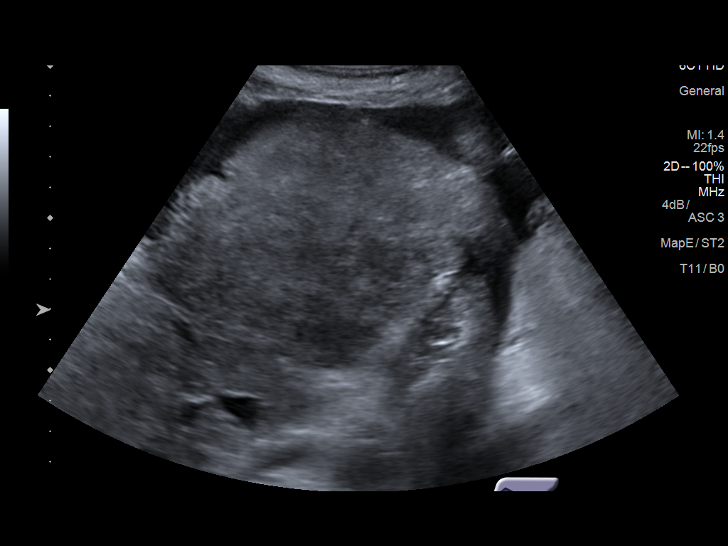
[im 27/71]
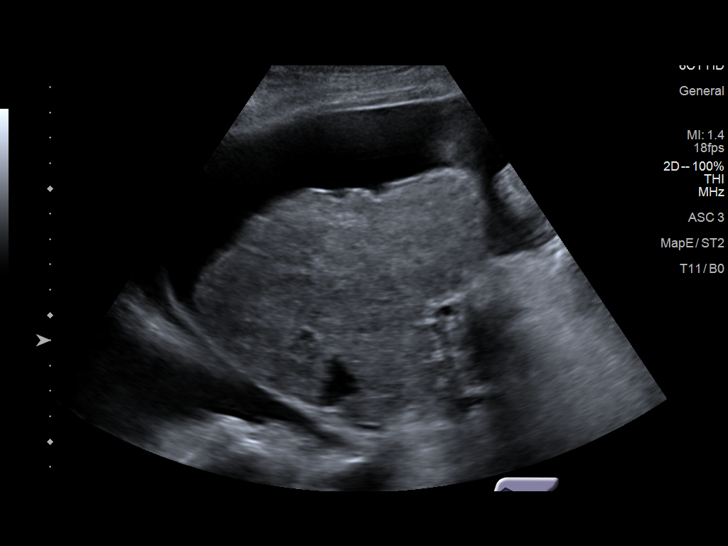
[im 33/71]
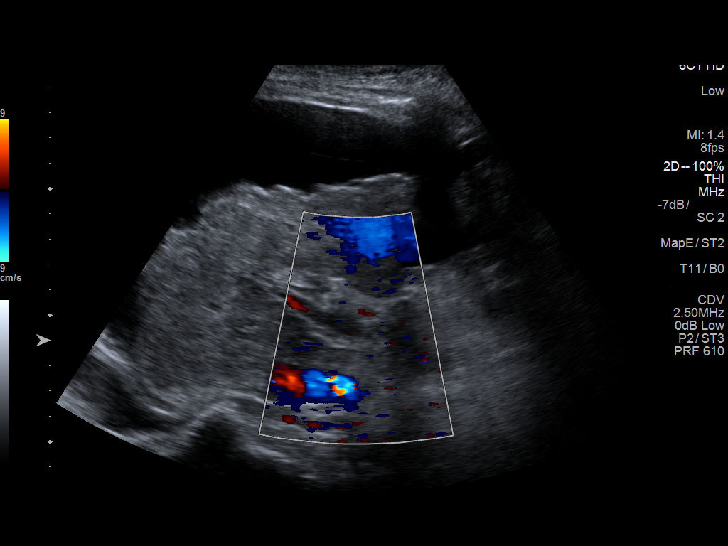
[im 38/71]
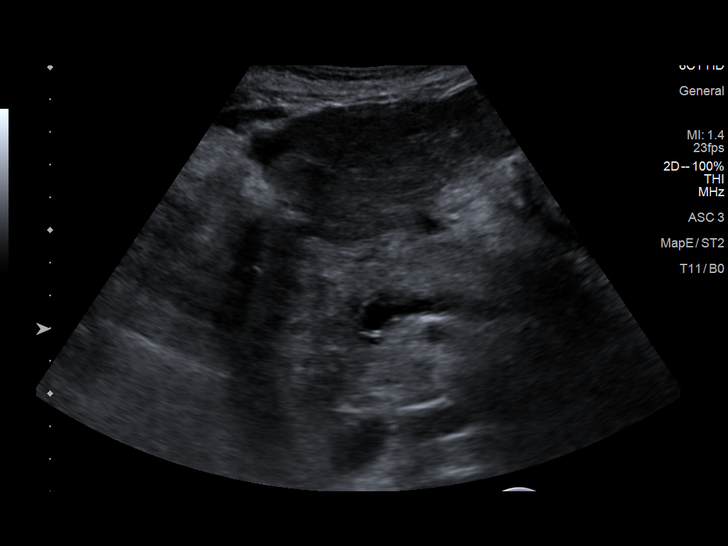
[im 44/71]
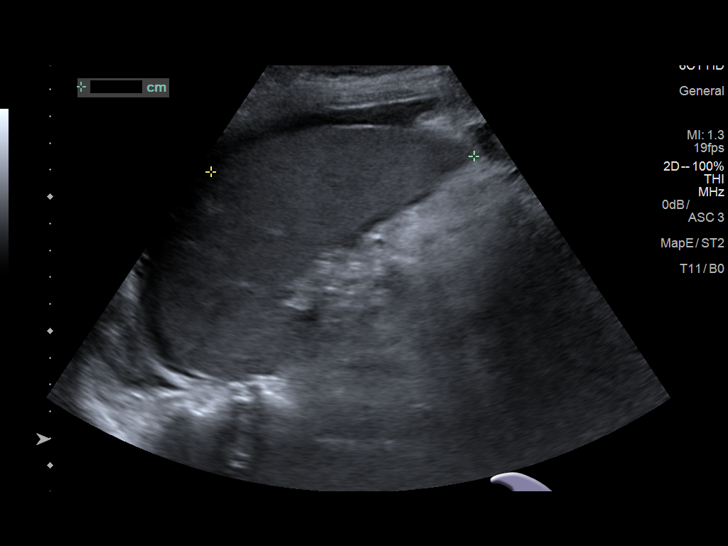
[im 47/71]
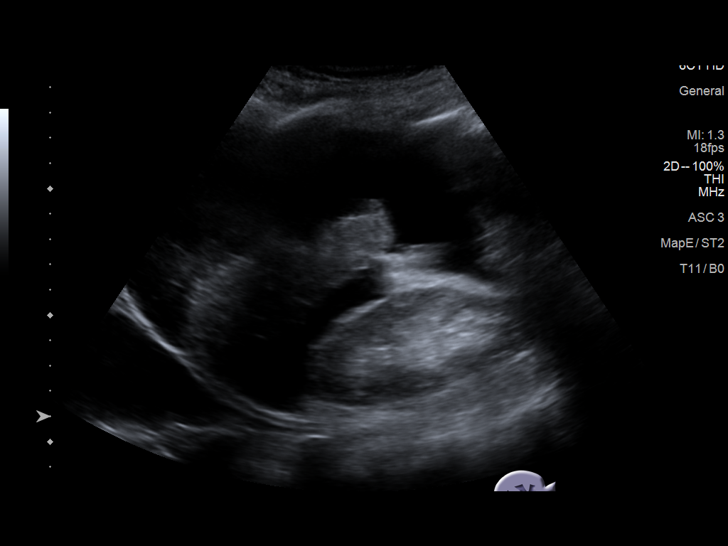
[im 53/71]
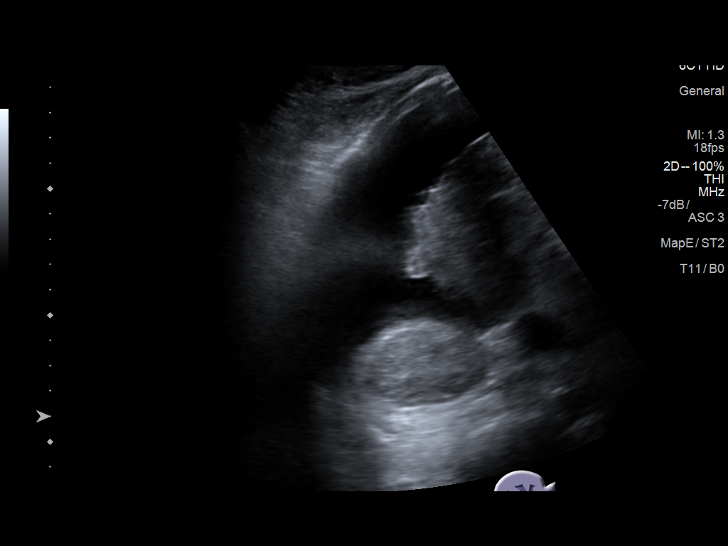
[im 59/71]
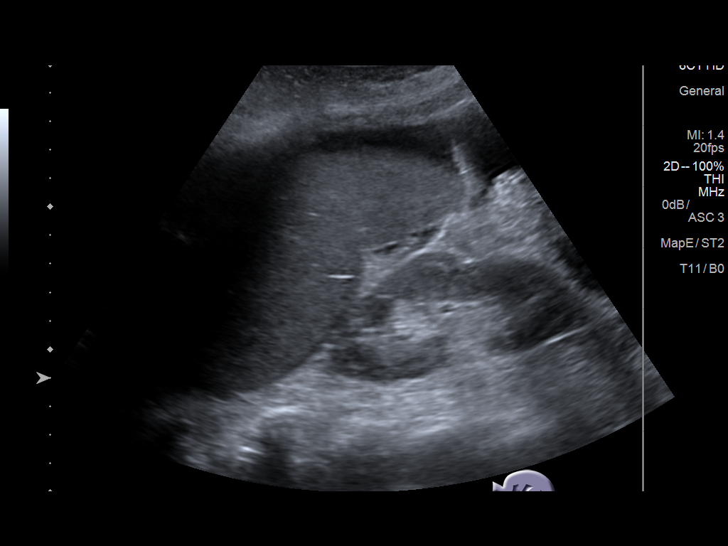
[im 65/71]
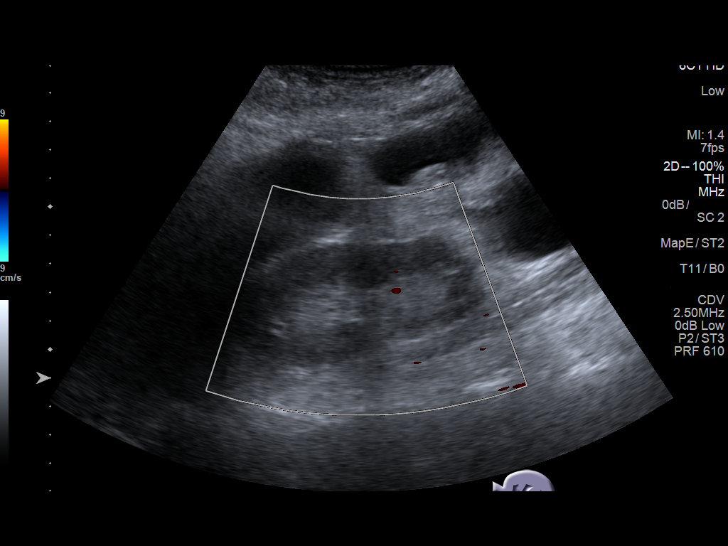
[im 71/71]
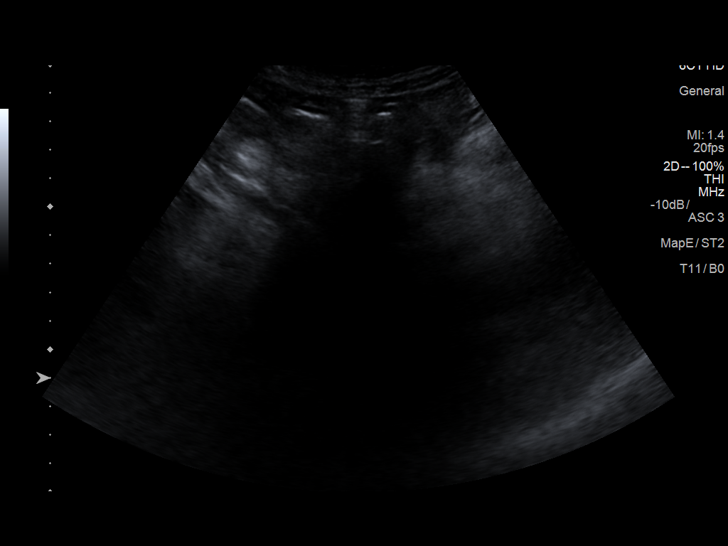

[14 of 25 positions shown; findings below may reference images not displayed]

FINDINGS: Gallbladder: The gallbladder wall is thickened. This is secondary to
adjacent ascites. No gallstones are noted. No sonographic Murphy
sign noted by sonographer.

Common bile duct: Diameter: 4.5 mm

Liver: There is nodular contour of the liver. The echotexture of the
liver is heterogeneous.

IVC: No abnormality visualized.

Pancreas: Visualized portion unremarkable.

Spleen: Size and appearance within normal limits.

Right Kidney: Length: 10 cm. Echogenicity within normal limits. No
mass or hydronephrosis visualized.

Left Kidney: Length: 9.7 cm. Echogenicity within normal limits. No
mass or hydronephrosis visualized.

Abdominal aorta: No aneurysm visualized.

Other findings: Ascites and bilateral pleural effusions are
identified.
IMPRESSION: Findings consistent with cirrhosis of liver. Ascites and bilateral
pleural effusions noted.
